# Patient Record
Sex: Female | Born: 1965 | Race: White | Hispanic: No | Marital: Married | Smoking: Never smoker
Health system: Southern US, Community
[De-identification: ages and names within clinical notes are randomized; demographics above are authoritative.]

## PROBLEM LIST (undated history)

## (undated) DIAGNOSIS — R112 Nausea with vomiting, unspecified: Secondary | ICD-10-CM

## (undated) DIAGNOSIS — I82409 Acute embolism and thrombosis of unspecified deep veins of unspecified lower extremity: Secondary | ICD-10-CM

## (undated) DIAGNOSIS — R000 Tachycardia, unspecified: Secondary | ICD-10-CM

## (undated) DIAGNOSIS — D682 Hereditary deficiency of other clotting factors: Secondary | ICD-10-CM

## (undated) DIAGNOSIS — I10 Essential (primary) hypertension: Secondary | ICD-10-CM

## (undated) DIAGNOSIS — E785 Hyperlipidemia, unspecified: Secondary | ICD-10-CM

## (undated) DIAGNOSIS — Z9889 Other specified postprocedural states: Secondary | ICD-10-CM

## (undated) DIAGNOSIS — I739 Peripheral vascular disease, unspecified: Secondary | ICD-10-CM

## (undated) DIAGNOSIS — R51 Headache: Secondary | ICD-10-CM

## (undated) HISTORY — PX: CHOLECYSTECTOMY: SHX55

## (undated) HISTORY — PX: TONSILLECTOMY: SUR1361

## (undated) HISTORY — PX: TUBAL LIGATION: SHX77

## (undated) HISTORY — PX: APPENDECTOMY: SHX54

## (undated) HISTORY — PX: RENAL CYST EXCISION: SUR506

## (undated) HISTORY — PX: OTHER SURGICAL HISTORY: SHX169

## (undated) HISTORY — PX: COLON SURGERY: SHX602

## (undated) HISTORY — DX: Hyperlipidemia, unspecified: E78.5

---

## 1996-07-26 HISTORY — PX: TUBAL LIGATION: SHX77

## 1997-07-26 HISTORY — PX: CHOLECYSTECTOMY: SHX55

## 1998-07-26 HISTORY — PX: BREAST REDUCTION SURGERY: SHX8

## 1998-09-11 ENCOUNTER — Other Ambulatory Visit: Admission: RE | Admit: 1998-09-11 | Discharge: 1998-09-11 | Payer: Self-pay | Admitting: Plastic Surgery

## 1999-08-14 ENCOUNTER — Other Ambulatory Visit: Admission: RE | Admit: 1999-08-14 | Discharge: 1999-08-14 | Payer: Self-pay | Admitting: *Deleted

## 2000-08-22 ENCOUNTER — Other Ambulatory Visit: Admission: RE | Admit: 2000-08-22 | Discharge: 2000-08-22 | Payer: Self-pay | Admitting: Obstetrics and Gynecology

## 2003-06-20 ENCOUNTER — Emergency Department (HOSPITAL_COMMUNITY): Admission: EM | Admit: 2003-06-20 | Discharge: 2003-06-20 | Payer: Self-pay | Admitting: Emergency Medicine

## 2003-09-09 ENCOUNTER — Other Ambulatory Visit: Admission: RE | Admit: 2003-09-09 | Discharge: 2003-09-09 | Payer: Self-pay | Admitting: Obstetrics and Gynecology

## 2005-01-28 ENCOUNTER — Emergency Department (HOSPITAL_COMMUNITY): Admission: EM | Admit: 2005-01-28 | Discharge: 2005-01-28 | Payer: Self-pay | Admitting: Family Medicine

## 2005-12-16 ENCOUNTER — Other Ambulatory Visit: Admission: RE | Admit: 2005-12-16 | Discharge: 2005-12-16 | Payer: Self-pay | Admitting: Obstetrics and Gynecology

## 2006-07-26 DIAGNOSIS — R000 Tachycardia, unspecified: Secondary | ICD-10-CM

## 2006-07-26 DIAGNOSIS — I739 Peripheral vascular disease, unspecified: Secondary | ICD-10-CM

## 2006-07-26 DIAGNOSIS — E785 Hyperlipidemia, unspecified: Secondary | ICD-10-CM

## 2006-07-26 HISTORY — DX: Peripheral vascular disease, unspecified: I73.9

## 2006-07-26 HISTORY — DX: Tachycardia, unspecified: R00.0

## 2006-07-26 HISTORY — DX: Hyperlipidemia, unspecified: E78.5

## 2006-12-08 ENCOUNTER — Encounter: Admission: RE | Admit: 2006-12-08 | Discharge: 2006-12-08 | Payer: Self-pay

## 2007-01-17 ENCOUNTER — Ambulatory Visit (HOSPITAL_COMMUNITY): Admission: RE | Admit: 2007-01-17 | Discharge: 2007-01-17 | Payer: Self-pay | Admitting: Obstetrics and Gynecology

## 2007-01-26 ENCOUNTER — Encounter (INDEPENDENT_AMBULATORY_CARE_PROVIDER_SITE_OTHER): Payer: Self-pay | Admitting: Obstetrics and Gynecology

## 2007-01-26 ENCOUNTER — Ambulatory Visit (HOSPITAL_COMMUNITY): Admission: RE | Admit: 2007-01-26 | Discharge: 2007-01-26 | Payer: Self-pay | Admitting: Obstetrics and Gynecology

## 2007-11-16 ENCOUNTER — Ambulatory Visit (HOSPITAL_COMMUNITY): Admission: RE | Admit: 2007-11-16 | Discharge: 2007-11-17 | Payer: Self-pay | Admitting: Surgery

## 2007-11-16 ENCOUNTER — Encounter (INDEPENDENT_AMBULATORY_CARE_PROVIDER_SITE_OTHER): Payer: Self-pay | Admitting: Surgery

## 2007-11-16 ENCOUNTER — Encounter: Payer: Self-pay | Admitting: Obstetrics and Gynecology

## 2008-03-06 ENCOUNTER — Ambulatory Visit (HOSPITAL_COMMUNITY): Admission: RE | Admit: 2008-03-06 | Discharge: 2008-03-06 | Payer: Self-pay | Admitting: *Deleted

## 2009-03-06 ENCOUNTER — Ambulatory Visit (HOSPITAL_COMMUNITY): Admission: RE | Admit: 2009-03-06 | Discharge: 2009-03-06 | Payer: Self-pay | Admitting: Internal Medicine

## 2009-04-05 ENCOUNTER — Emergency Department (HOSPITAL_COMMUNITY): Admission: EM | Admit: 2009-04-05 | Discharge: 2009-04-05 | Payer: Self-pay | Admitting: Family Medicine

## 2009-04-05 ENCOUNTER — Emergency Department (HOSPITAL_COMMUNITY): Admission: EM | Admit: 2009-04-05 | Discharge: 2009-04-05 | Payer: Self-pay | Admitting: Emergency Medicine

## 2010-05-15 ENCOUNTER — Encounter: Admission: RE | Admit: 2010-05-15 | Discharge: 2010-05-15 | Payer: Self-pay | Admitting: Sports Medicine

## 2010-08-15 ENCOUNTER — Encounter: Payer: Self-pay | Admitting: Sports Medicine

## 2010-08-17 ENCOUNTER — Encounter: Payer: Self-pay | Admitting: Internal Medicine

## 2010-12-08 NOTE — Op Note (Signed)
Angela Washington, Angela Washington NO.:  [id number]   MEDICAL RECORD NO.:  [id number]          PATIENT TYPE:  INP   LOCATION:  1506                         FACILITY:  [facility]   PHYSICIAN:  Wilmon Arms. Corliss Skains, M.D. DATE OF BIRTH:  04-Jul-1966   DATE OF PROCEDURE:  11/16/2007  DATE OF DISCHARGE:                               OPERATIVE REPORT   PREOPERATIVE DIAGNOSES:  Acute appendicitis.   POSTOPERATIVE DIAGNOSES:  1. Mild appendicitis.  2. Ruptured hemorrhagic left ovarian cyst.   PROCEDURE PERFORMED:  1. Laparoscopic appendectomy.  2. Laparoscopic left ovarian cystectomy.   SURGEON:  Wilmon Arms. Corliss Skains, M.D., FACS   ANESTHESIA:  General endotracheal.   INDICATIONS:  The patient is a 45 year old female who presents with 1  day of sudden onset of right lower quadrant pain.  She was initially  evaluated at [facility] and her white count was mildly elevated at  10.8.  A CT scan showed findings consistent with a possible early  appendicitis.  Her appendiceal wall was mildly thickened at its base.  She also was noted to have a large left ovarian cysts.  This was  confirmed with pelvic ultrasound.  It is felt that the cyst was a simple  cyst and was otherwise unremarkable.  She was then referred for  consultation.  We decided to move her over to [facility] to perform a  laparoscopic appendectomy.  Her abdominal examination was consistent  with this diagnosis.   DESCRIPTION OF PROCEDURE:  The patient was brought to the operating room  and placed in supine position on the operating table.  After an adequate  level of general anesthesia was obtained a Foley catheter was placed  under sterile technique.  The patient's abdomen was prepped with  Betadine and draped in sterile fashion.  Time-out was taken to assure  proper patient and proper procedure.  Her previous laparoscopic incision  below her umbilicus was infiltrated with 0.25% Marcaine with  epinephrine.  Dissection was  carried down to the fascia.  The fascia was  opened vertically.  We entered the peritoneal cavity bluntly.  A stay  suture of 0 Vicryl was placed around the fascial opening.  The Hasson  cannula was inserted, secured to the stay suture.  Pneumoperitoneum was  obtained by insufflating CO2, maintaining maximal pressure of 15 mmHg.  The laparoscope was inserted.  Immediately I noticed a lot of blood  staining over the omentum with some pooling of blood in the pelvis.  The  peritoneum appeared generally irritated.  We placed a 5 mm port in the  right upper quadrant and another 5 mm port in the left lower quadrant.  The patient was positioned in Trendelenburg and tilted to her left.  We  mobilized the cecum medially.  A long narrow appendix was identified and  was grasped with Glassman clamp.  There was minimal inflammation but the  appendix did feel mildly thickened near its base.  The mesoappendix was  divided with the harmonic scalpel.  The base of the appendix was then  amputated with a Endo-GIA stapler.  One edge  of the staple line was  oozing.  This was controlled with the harmonic scalpel.  Hemostasis was  good at the staple line.  This area was thoroughly irrigated.  We then  began exploring her pelvis for other signs of the bloody appearing  fluid.  Her right ovary was elevated and was grossly normal.  We then  elevated the left ovary.  The large cyst that had previously been  identified was easily seen.  There were two openings in the cyst and  blood was actively oozing from this area.  It appeared that this was the  source of her pelvic irritation.  The cyst was bluntly opened wider with  the suction tip.  We evacuated the cyst contents.  Some of these  contents were removed and sent for pathologic examination.  I then  thoroughly irrigated the cyst wall.  There were several bleeding areas  which were thoroughly cauterized.  Once hemostasis was adequate we  thoroughly irrigated the  anterior abdominal wall in the pelvis.  We  suctioned out as much irrigation as possible.  We once again inspected  our staple line in the right lower quadrant and there was no sign of  bleeding or leak.  Pneumoperitoneum was then released as we removed the  trocars.  The stay sutures used to close umbilical fascia.  Deep sutures  of 4-0 Monocryl were placed.  Dermabond was used to seal the skin.  The  patient was then extubated and brought to recovery room in stable  condition.  The Foley catheter was removed.      Wilmon Arms. Tsuei, M.D.  Electronically Signed     MKT/MEDQ  D:  11/16/2007  T:  11/17/2007  Job:  [id number]   cc:   Hal Morales, M.D.  Fax: [phone]

## 2010-12-08 NOTE — Op Note (Signed)
Angela Washington, Angela Washington                ACCOUNT NO.:  [id number]   MEDICAL RECORD NO.:  [id number]          PATIENT TYPE:  AMB   LOCATION:  SDC                           FACILITY:  [facility]   PHYSICIAN:  Angela Washington, M.D. DATE OF BIRTH:  [birth year]   DATE OF PROCEDURE:  01/26/2007  DATE OF DISCHARGE:                               OPERATIVE REPORT   PREOPERATIVE DIAGNOSIS:  Menorrhagia and uterine fibroids.   POSTOPERATIVE DIAGNOSIS:  Menorrhagia and uterine fibroids.   PROCEDURE:  D&C hysteroscopy, endometrial ablation with ThermaChoice.   SURGEON:  Dr. Normand Washington and no assistants.   ANESTHESIA:  Laryngeal mask airway and local.   SPECIMENS:  Endometrial curettings.   BLOOD LOSS:  Was minimal.   COMPLICATIONS:  None.  The patient went to PACU in stable condition.   DESCRIPTION OF PROCEDURE:  The patient taken to the operating room where  she was given general anesthesia, placed in dorsal lithotomy position  and prepped and draped in a normal sterile fashion.  Her bladder was  drained with a catheter.  Uterus was found to be 9-10 weeks size,  anteverted with no adnexal masses.  A bivalve speculum placed into the  vagina.  The anterior lip of the cervix was grasped single-tooth  tenaculum.  The uterus did sound to 9 cm. The cervix was further dilated  Pratt dilators up to 21. The hysteroscope was placed into the uterine  cavity.  The patient had scant endometrium.  Both ostia were visualized  and there were no polyps or submucosal fibroids seen.  The hysteroscope  was then removed.  Sharp endometrial curettings were obtained with the  uterine curette and sent to pathology.  The ThermaChoice was done per  protocol.  The probe was placed into the uterus and sounded to 9 cm and  had a warm-up period of 1 minute and 15 seconds and ablated the uterus  for 8 minutes and had a cool down period of about 2 minutes. The  ThermaChoice was removed.  The second look was done with the  hysteroscope. Both ostia were visualized which [place] pink.  Most of the  endometrium had been ablated.  There was just moved was slight pink area  near both ostia but I would say 95% of the uterus was ablated.  Sponge,  lap and needle counts were correct.  The tenaculum site was hemostatic.  The patient went to recovery room in stable condition.      Angela A. Angela Washington, M.D.  Electronically Signed     NAD/MEDQ  D:  01/26/2007  T:  01/26/2007  Job:  [id number]

## 2010-12-08 NOTE — H&P (Signed)
Angela Washington, Washington                ACCOUNT NO.:  [id number]   MEDICAL RECORD NO.:  [id number]          PATIENT TYPE:  AMB   LOCATION:  SDC                           FACILITY:  [facility]   PHYSICIAN:  Angela Washington, M.D. DATE OF BIRTH:  16-Oct-1965   DATE OF ADMISSION:  01/17/2007  DATE OF DISCHARGE:  01/17/2007                              HISTORY & PHYSICAL   CONFIDENTIAL.   The patient is a 45 year old female gravida 3, para 2, who presented to  me in June of 2008, complaining of right-sided pain and heavy periods.  She occasionally takes Motrin for the right-sided pain.  Also has a  history of a right ovarian cyst.  Her menses occur every month but soak  through her clothes for the first 2 days of her menstrual cycle.  Her  menstrual cycle lasts about 5 days.  She denies any shortness of breath  or chest pain.  She does have a small 1.3 cm fibroid on ultrasound.  She  is currently on no contraception, not on hormone therapy.  She has no  new medications.  She has no menopausal symptoms or vaginal discharge.  She does have cramping with her menses which is usually relieved with  Motrin and no increased stress.  Patient denies any vaginal discharge or  odor, dysuria, urgency, frequency or hematuria, history of kidney  stones, constipation, diarrhea, nausea, vomiting or endometriosis.  She  has a history of an ovarian cyst.  She denies any bleeding disorders or  thyroid disorders.  Patient says that the bleeding does interrupt her to  be able to do her normal daily activities every month.   PAST MEDICAL HISTORY:  Significant for pancreatitis.   PAST SURGICAL HISTORY:  Significant for:  1. Cesarean section x2.  2. Cholecystectomy.  3. Bilateral tubal ligation.   MEDICATIONS:  Calcium and folic acid.   ALLERGIES:  1. MORPHINE.  2. UNASYN.   SOCIAL HISTORY:  Negative for tobacco, alcohol and illicit drug use.   PHYSICAL EXAM:  Patient weighs 168 pounds, blood pressure is  120/80.  Pupils are equal, hearing is normal, throat is clear, thyroid is not  enlarged.  HEART:  Regular rate and rhythm.  LUNGS:  Clear to auscultation bilaterally.  BREASTS:  No masses, discharge, skin changes or nipple retraction.  BACK:  No CVA tenderness bilaterally.  ABDOMEN:  Nontender without any masses or organomegaly.  EXTREMITIES:  No cyanosis, clubbing or edema.  NEURO EXAM:  Within normal limits.  VULVOVAGINAL EXAM:  Within normal limits.  CERVIX:  Nontender without any lesions.  UTERUS:  Normal shape, size and consistency and nontender.  ADNEXA:  No masses.   On ultrasound, her uterus is normal size with a 1.3 cm fibroid, normal  ovaries bilaterally.   ASSESSMENT:  Menorrhagia.  All different treatments were reviewed with  the patient from observation to birth control pills, ablation,  hysterectomy and also treatment for the fibroid.  Patient has decided to  go with endometrial ablation.  She understands the risks are, but not  limited to, bleeding, infection, perforation of the uterus  and damage to  internal organs like bowel and bladder, major blood vessels.  The  patient understands that there is a small failure rate of about 10-15%  and she still wants to proceed.  The patient also had a Pap smear which  showed azygos was negative, high risk HPV.  She desires to repeat the  Pap smear in a year.      Angela Washington, M.D.  Electronically Signed     NAD/MEDQ  D:  01/25/2007  T:  01/26/2007  Job:  [id number]

## 2011-04-20 LAB — COMPREHENSIVE METABOLIC PANEL
ALT: 16
AST: 18
Albumin: 3.8
Alkaline Phosphatase: 65
BUN: 11
CO2: 27
Calcium: 9.3
Chloride: 98
Creatinine, Ser: 0.79
GFR calc Af Amer: >60
GFR calc non Af Amer: >60
Glucose, Bld: 92
Potassium: 4.3
Sodium: 134 — ABNORMAL LOW
Total Bilirubin: 1
Total Protein: 7.2

## 2011-04-20 LAB — CBC
HCT: 44.7
Hemoglobin: 15.7 — ABNORMAL HIGH
MCHC: 35.1
MCV: 88.8
Platelets: 253
RBC: 5.03
RDW: 12.4
WBC: 10.8 — ABNORMAL HIGH

## 2011-04-20 LAB — DIFFERENTIAL
Basophils Absolute: 0
Basophils Relative: 0
Eosinophils Absolute: 0.1
Eosinophils Relative: 1
Lymphocytes Relative: 25
Lymphs Abs: 2.7
Monocytes Absolute: 0.9
Monocytes Relative: 9
Neutro Abs: 7
Neutrophils Relative %: 65

## 2011-04-20 LAB — PREGNANCY, URINE: Preg Test, Ur: NEGATIVE

## 2011-04-20 LAB — AMYLASE: Amylase: 103

## 2011-04-20 LAB — LIPASE, BLOOD: Lipase: 31

## 2011-05-11 LAB — CBC
HCT: 44.4
Hemoglobin: 15.4 — ABNORMAL HIGH
MCHC: 34.6
MCV: 86.7
Platelets: 277
RBC: 5.13 — ABNORMAL HIGH
RDW: 12.9
WBC: 7.4

## 2011-05-11 LAB — PREGNANCY, URINE: Preg Test, Ur: NEGATIVE

## 2011-06-23 ENCOUNTER — Ambulatory Visit (HOSPITAL_COMMUNITY)
Admission: RE | Admit: 2011-06-23 | Discharge: 2011-06-23 | Disposition: A | Discharge status: Home / Self Care | Payer: BC Managed Care – PPO | Source: Ambulatory Visit | Attending: Internal Medicine | Admitting: Internal Medicine

## 2011-06-23 ENCOUNTER — Other Ambulatory Visit (HOSPITAL_COMMUNITY): Payer: Self-pay | Admitting: Internal Medicine

## 2011-06-23 DIAGNOSIS — R609 Edema, unspecified: Secondary | ICD-10-CM

## 2011-06-23 DIAGNOSIS — I8000 Phlebitis and thrombophlebitis of superficial vessels of unspecified lower extremity: Secondary | ICD-10-CM | POA: Insufficient documentation

## 2011-06-23 DIAGNOSIS — M79609 Pain in unspecified limb: Secondary | ICD-10-CM | POA: Insufficient documentation

## 2011-06-30 ENCOUNTER — Ambulatory Visit: Payer: BC Managed Care – PPO | Attending: Gynecologic Oncology | Admitting: Gynecologic Oncology

## 2011-06-30 ENCOUNTER — Encounter: Payer: Self-pay | Admitting: Gynecologic Oncology

## 2011-06-30 VITALS — BP 110/76 | HR 60 | Temp 97.7°F | Resp 14 | Ht 66.42 in | Wt 178.4 lb

## 2011-06-30 DIAGNOSIS — N72 Inflammatory disease of cervix uteri: Secondary | ICD-10-CM | POA: Insufficient documentation

## 2011-06-30 DIAGNOSIS — E785 Hyperlipidemia, unspecified: Secondary | ICD-10-CM | POA: Insufficient documentation

## 2011-06-30 DIAGNOSIS — N839 Noninflammatory disorder of ovary, fallopian tube and broad ligament, unspecified: Secondary | ICD-10-CM | POA: Insufficient documentation

## 2011-06-30 DIAGNOSIS — Z79899 Other long term (current) drug therapy: Secondary | ICD-10-CM | POA: Insufficient documentation

## 2011-06-30 DIAGNOSIS — R1900 Intra-abdominal and pelvic swelling, mass and lump, unspecified site: Secondary | ICD-10-CM

## 2011-06-30 DIAGNOSIS — IMO0002 Reserved for concepts with insufficient information to code with codable children: Secondary | ICD-10-CM | POA: Insufficient documentation

## 2011-06-30 DIAGNOSIS — D6859 Other primary thrombophilia: Secondary | ICD-10-CM | POA: Insufficient documentation

## 2011-06-30 DIAGNOSIS — R109 Unspecified abdominal pain: Secondary | ICD-10-CM | POA: Insufficient documentation

## 2011-06-30 DIAGNOSIS — Z7901 Long term (current) use of anticoagulants: Secondary | ICD-10-CM | POA: Insufficient documentation

## 2011-06-30 DIAGNOSIS — R232 Flushing: Secondary | ICD-10-CM | POA: Insufficient documentation

## 2011-06-30 DIAGNOSIS — R52 Pain, unspecified: Secondary | ICD-10-CM

## 2011-06-30 NOTE — Patient Instructions (Signed)
To schedule your MRI and I will call you with the results.

## 2011-06-30 NOTE — Progress Notes (Signed)
Consult Note: Gyn-Onc  Elijah Birk 45 y.o. female  CC:  Chief Complaint  Patient presents with  . Abdominal Pain    HPI: Patient contain consultation request of Dr. Normand Sloop. Ms. Neiss is a 45 year old gravida 3 para 2 aborta 1. She's not having any menstrual cycles as she underwent endometrial ablation in 2008. She was seen by Dr. Normand Sloop has abdominal pain and pelvic mass is appreciated on ultrasound. This then prompted an ultrasound in August of 2008 this showed a simple-appearing 3 cm right ovarian mass. I do not have records from that visit. Followup ultrasound was then performed on November 28 revealed a right ovarian septated cyst measuring 3.9 x 1.7 x 1.8 cm with no increased flow. The cervix had multiple cysts ranging from 1.2.5 centimeters. It is in the body fibroid measuring 1.9 x 2.1 x 1.4 cm. Her CA 125 is normal. The ultrasound findings prompted a consultation today. She is doing quite well. 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 She does have hot flashes though not regular. She does have occasional dyspareunia and has been going on for a few months. She denies any early satiety she does have some achiness her abdomen. She has a change about bladder habits an intentional weight loss weight gain nausea or vomiting. She had a right groin phlebitis but has resolved on its own.  Interval History: As above  Review of Systems: As above  Current Meds:  Outpatient Encounter Prescriptions as of 06/30/2011  Medication Sig Dispense Refill  . folic acid (FOLVITE) 1 MG tablet Take 1 mg by mouth daily.        . simvastatin (ZOCOR) 40 MG tablet Take 40 mg by mouth 1 day or 1 dose.        . spironolactone (ALDACTONE) 25 MG tablet Take 25 mg by mouth daily.        Marland Kitchen warfarin (COUMADIN) 2.5 MG tablet Take 2.5 mg by mouth daily. Alternate between 2.5 and 5 mg         Allergy:  Allergies  Allergen Reactions  . Morphine And Related Hives    Social Hx:   History     Social History  . Marital Status: Married    Spouse Name: N/A    Number of Children: N/A  . Years of Education: N/A   Occupational History  . Not on file.   Social History Main Topics  . Smoking status: Never Smoker   . Smokeless tobacco: Not on file  . Alcohol Use: No  . Drug Use: No  . Sexually Active: Yes   Other Topics Concern  . Not on file   Social History Narrative  . No narrative on file    Past Surgical Hx:  Past Surgical History  Procedure Date  . Tonsillectomy   . Cesarean section 1994, 1998    2  . Lapor     Ovarian Cyst and appedectomy  . Appendectomy   . Cholecystectomy   . Tubal ligation   . 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 Past Medical History  Diagnosis Date  . Hyperlipidemia 2008    Family Hx: Her father died at the age of 51 seconds death. Paternal grandfather and 2 paternal uncles died is intact and the age of 49. A paternal aunt died suddenly at the age of 77. They're most likely related to a prothrombin deficiency  but was not diagnosed at the time.  Family History  Problem Relation Age of Onset  . Parkinsonism Mother   . Heart attack Mother   . Heart attack Father   . Thyroid cancer Sister     Vitals:  Blood pressure 110/76, pulse 60, temperature 97.7 F (36.5 C), temperature source Oral, resp. rate 14, height 5' 6.42" (1.687 m), weight 178 lb 6.4 oz (80.922 kg).  Physical Exam: Well-developed female in no acute distress.  Neck: Supple no lymphadenopathy no thyromegaly.  Lungs: Clear to auscultation bilaterally.  Cardiovascular exam: Regular rate and rhythm.  Abdomen: Surgical incisions. No evidence of an incisional hernia. Abdomen is soft tender to deep palpation in the upper quadrant. No distinct masses.   Groin: He.  Extremities: No edema.  Pelvic external genitalia within normal limits. The vagina is well epithelialized. The other  process. O2 nabothian cysts seen at 9:00 and nabothian cysts seen at approximately 3:00. You should measure approximately 4-5 mm. Bimanual examination the cervix is palpable normal the purpose of normal size shape and consistency. In the right adnexa there is a 4 cm mass. There is no nodularity rectal confirms.   Assessment/Plan:45 year old with history of endometrial ablation as well as right ovarian hemorrhagic cyst. 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 The mass is palpable this could very well be related to another hemorrhagic cyst. In addition she has several cysts noted in the uterus and cervix. I have had several patients with these and all of them have have been post ablation. We discussed the pros and cons of additional imaging regarding ultrasound and potentially MRI. At this point she wishes to proceed with an MRI to better evaluate the endomyometrium as well as take a look at the ovarian mass. We will put the order in and then she'll schedule her convenience. She does not recall her of the results of the MRI and we'll determine her disposition pending these results. Her questions were elicited in answer to her satisfaction. She was pleased with her consultation.   Witney Huie A., MD 06/30/2011, 2:04 PM

## 2011-07-02 ENCOUNTER — Ambulatory Visit (HOSPITAL_COMMUNITY)
Admission: RE | Admit: 2011-07-02 | Discharge: 2011-07-02 | Disposition: A | Discharge status: Home / Self Care | Payer: BC Managed Care – PPO | Source: Ambulatory Visit | Attending: Gynecologic Oncology | Admitting: Gynecologic Oncology

## 2011-07-02 DIAGNOSIS — N949 Unspecified condition associated with female genital organs and menstrual cycle: Secondary | ICD-10-CM | POA: Insufficient documentation

## 2011-07-02 DIAGNOSIS — N7013 Chronic salpingitis and oophoritis: Secondary | ICD-10-CM | POA: Insufficient documentation

## 2011-07-02 DIAGNOSIS — N83209 Unspecified ovarian cyst, unspecified side: Secondary | ICD-10-CM | POA: Insufficient documentation

## 2011-07-02 DIAGNOSIS — R1900 Intra-abdominal and pelvic swelling, mass and lump, unspecified site: Secondary | ICD-10-CM

## 2011-07-02 MED ORDER — GADOBENATE DIMEGLUMINE 529 MG/ML IV SOLN
15.0000 mL | Freq: Once | INTRAVENOUS | Status: AC | PRN
  Administered 2011-07-02: 15 mL via INTRAVENOUS

## 2011-07-06 ENCOUNTER — Ambulatory Visit: Payer: BC Managed Care – PPO | Admitting: Gynecologic Oncology

## 2011-07-27 HISTORY — PX: APPENDECTOMY: SHX54

## 2012-02-24 ENCOUNTER — Other Ambulatory Visit: Payer: Self-pay | Admitting: Obstetrics and Gynecology

## 2012-02-24 ENCOUNTER — Encounter (HOSPITAL_COMMUNITY): Payer: Self-pay

## 2012-02-24 ENCOUNTER — Encounter (HOSPITAL_COMMUNITY): Payer: Self-pay | Admitting: Anesthesiology

## 2012-02-24 ENCOUNTER — Encounter (HOSPITAL_COMMUNITY)
Admission: AD | Disposition: A | Discharge status: Home / Self Care | Payer: Self-pay | Source: Ambulatory Visit | Attending: Obstetrics and Gynecology

## 2012-02-24 ENCOUNTER — Inpatient Hospital Stay (HOSPITAL_COMMUNITY): Payer: BC Managed Care – PPO | Admitting: Anesthesiology

## 2012-02-24 ENCOUNTER — Inpatient Hospital Stay (HOSPITAL_COMMUNITY): Admission: AD | Admit: 2012-02-24 | Payer: BC Managed Care – PPO

## 2012-02-24 ENCOUNTER — Ambulatory Visit (HOSPITAL_COMMUNITY)
Admission: AD | Admit: 2012-02-24 | Discharge: 2012-02-25 | Disposition: A | Discharge status: Home / Self Care | Payer: BC Managed Care – PPO | Source: Ambulatory Visit | Attending: Obstetrics and Gynecology | Admitting: Obstetrics and Gynecology

## 2012-02-24 DIAGNOSIS — N83519 Torsion of ovary and ovarian pedicle, unspecified side: Secondary | ICD-10-CM

## 2012-02-24 DIAGNOSIS — R1031 Right lower quadrant pain: Secondary | ICD-10-CM | POA: Insufficient documentation

## 2012-02-24 DIAGNOSIS — N8353 Torsion of ovary, ovarian pedicle and fallopian tube: Secondary | ICD-10-CM | POA: Insufficient documentation

## 2012-02-24 DIAGNOSIS — N83209 Unspecified ovarian cyst, unspecified side: Secondary | ICD-10-CM | POA: Insufficient documentation

## 2012-02-24 HISTORY — PX: SALPINGOOPHORECTOMY: SHX82

## 2012-02-24 HISTORY — PX: LAPAROSCOPY: SHX197

## 2012-02-24 LAB — ABO/RH: ABO/RH(D): A POS

## 2012-02-24 LAB — APTT: aPTT: 50 seconds — ABNORMAL HIGH (ref 24–37)

## 2012-02-24 LAB — PROTIME-INR
INR: 3 — ABNORMAL HIGH (ref 0.00–1.49)
Prothrombin Time: 31.6 seconds — ABNORMAL HIGH (ref 11.6–15.2)

## 2012-02-24 LAB — CBC
Hemoglobin: 15.9 g/dL — ABNORMAL HIGH (ref 12.0–15.0)
MCH: 30 pg (ref 26.0–34.0)
MCHC: 34.9 g/dL (ref 30.0–36.0)
MCV: 86 fL (ref 78.0–100.0)
RBC: 5.3 MIL/uL — ABNORMAL HIGH (ref 3.87–5.11)

## 2012-02-24 SURGERY — LAPAROSCOPY OPERATIVE
Anesthesia: General | Laterality: Right | Wound class: Clean Contaminated

## 2012-02-24 MED ORDER — FENTANYL CITRATE 0.05 MG/ML IJ SOLN
INTRAMUSCULAR | Status: DC | PRN
  Administered 2012-02-24: 100 ug via INTRAVENOUS
  Administered 2012-02-24: 50 ug via INTRAVENOUS
  Administered 2012-02-24: 100 ug via INTRAVENOUS

## 2012-02-24 MED ORDER — IBUPROFEN 800 MG PO TABS: 800.0000 mg | ORAL_TABLET | Freq: Three times a day (TID) | ORAL | Status: DC | PRN

## 2012-02-24 MED ORDER — DIPHENHYDRAMINE HCL 50 MG/ML IJ SOLN: 12.5000 mg | Freq: Four times a day (QID) | INTRAMUSCULAR | Status: DC | PRN

## 2012-02-24 MED ORDER — FENTANYL CITRATE 0.05 MG/ML IJ SOLN: 25.0000 ug | INTRAMUSCULAR | Status: DC | PRN

## 2012-02-24 MED ORDER — HYDROCODONE-ACETAMINOPHEN 5-325 MG PO TABS
2.0000 | ORAL_TABLET | ORAL | Status: DC | PRN
  Administered 2012-02-24: 1 via ORAL
  Administered 2012-02-25: 2 via ORAL
  Administered 2012-02-25: 1 via ORAL
  Filled 2012-02-24: qty 1
  Filled 2012-02-24: qty 2
  Filled 2012-02-24: qty 1

## 2012-02-24 MED ORDER — MIDAZOLAM HCL 5 MG/5ML IJ SOLN
INTRAMUSCULAR | Status: DC | PRN
  Administered 2012-02-24: 2 mg via INTRAVENOUS

## 2012-02-24 MED ORDER — ONDANSETRON HCL 4 MG/2ML IJ SOLN
INTRAMUSCULAR | Status: DC | PRN
  Administered 2012-02-24: 8 mg via INTRAVENOUS

## 2012-02-24 MED ORDER — FAMOTIDINE IN NACL 20-0.9 MG/50ML-% IV SOLN
20.0000 mg | Freq: Once | INTRAVENOUS | Status: AC
  Administered 2012-02-24: 20 mg via INTRAVENOUS
  Filled 2012-02-24: qty 50

## 2012-02-24 MED ORDER — VITAMIN K1 10 MG/ML IJ SOLN
10.0000 mg | Freq: Once | INTRAVENOUS | Status: AC
  Administered 2012-02-24: 10 mg via INTRAVENOUS
  Filled 2012-02-24: qty 1

## 2012-02-24 MED ORDER — HYDROMORPHONE 0.3 MG/ML IV SOLN: INTRAVENOUS | Status: DC

## 2012-02-24 MED ORDER — ONDANSETRON HCL 4 MG/2ML IJ SOLN: 4.0000 mg | Freq: Four times a day (QID) | INTRAMUSCULAR | Status: DC | PRN

## 2012-02-24 MED ORDER — CEFAZOLIN SODIUM-DEXTROSE 2-3 GM-% IV SOLR
2.0000 g | INTRAVENOUS | Status: AC
  Administered 2012-02-24: 2 g via INTRAVENOUS
  Filled 2012-02-24: qty 50

## 2012-02-24 MED ORDER — SPIRONOLACTONE 25 MG PO TABS
25.0000 mg | ORAL_TABLET | Freq: Every day | ORAL | Status: DC
  Administered 2012-02-25: 25 mg via ORAL
  Filled 2012-02-24 (×2): qty 1

## 2012-02-24 MED ORDER — MIDAZOLAM HCL 2 MG/2ML IJ SOLN: 0.5000 mg | Freq: Once | INTRAMUSCULAR | Status: DC | PRN

## 2012-02-24 MED ORDER — FOLIC ACID 1 MG PO TABS
1.0000 mg | ORAL_TABLET | Freq: Every day | ORAL | Status: DC
  Administered 2012-02-25: 1 mg via ORAL
  Filled 2012-02-24 (×2): qty 1

## 2012-02-24 MED ORDER — GLYCOPYRROLATE 0.2 MG/ML IJ SOLN
INTRAMUSCULAR | Status: DC | PRN
  Administered 2012-02-24: 0.6 mg via INTRAVENOUS

## 2012-02-24 MED ORDER — DIPHENHYDRAMINE HCL 12.5 MG/5ML PO ELIX: 12.5000 mg | ORAL_SOLUTION | Freq: Four times a day (QID) | ORAL | Status: DC | PRN

## 2012-02-24 MED ORDER — MEPERIDINE HCL 25 MG/ML IJ SOLN: 6.2500 mg | INTRAMUSCULAR | Status: DC | PRN

## 2012-02-24 MED ORDER — WARFARIN SODIUM 5 MG PO TABS
5.0000 mg | ORAL_TABLET | Freq: Every day | ORAL | Status: DC
  Filled 2012-02-24: qty 1

## 2012-02-24 MED ORDER — DEXTROSE IN LACTATED RINGERS 5 % IV SOLN
INTRAVENOUS | Status: DC
  Administered 2012-02-24: 1000 mL via INTRAVENOUS

## 2012-02-24 MED ORDER — PROMETHAZINE HCL 25 MG/ML IJ SOLN
6.2500 mg | INTRAMUSCULAR | Status: AC | PRN
  Administered 2012-02-24 (×2): 6.25 mg via INTRAVENOUS

## 2012-02-24 MED ORDER — SCOPOLAMINE 1 MG/3DAYS TD PT72
MEDICATED_PATCH | TRANSDERMAL | Status: AC
  Filled 2012-02-24: qty 1

## 2012-02-24 MED ORDER — HYDROMORPHONE HCL PF 1 MG/ML IJ SOLN
2.0000 mg | Freq: Once | INTRAMUSCULAR | Status: AC | PRN
  Administered 2012-02-24: 2 mg via INTRAVENOUS
  Filled 2012-02-24 (×2): qty 2

## 2012-02-24 MED ORDER — MENTHOL 3 MG MT LOZG: 1.0000 | LOZENGE | OROMUCOSAL | Status: DC | PRN

## 2012-02-24 MED ORDER — KETOROLAC TROMETHAMINE 30 MG/ML IJ SOLN: 15.0000 mg | Freq: Once | INTRAMUSCULAR | Status: DC | PRN

## 2012-02-24 MED ORDER — ZOLPIDEM TARTRATE 5 MG PO TABS: 5.0000 mg | ORAL_TABLET | Freq: Every evening | ORAL | Status: DC | PRN

## 2012-02-24 MED ORDER — NEOSTIGMINE METHYLSULFATE 1 MG/ML IJ SOLN
INTRAMUSCULAR | Status: DC | PRN
  Administered 2012-02-24: 4 mg via INTRAVENOUS

## 2012-02-24 MED ORDER — ENOXAPARIN SODIUM 80 MG/0.8ML ~~LOC~~ SOLN
1.0000 mg/kg | Freq: Two times a day (BID) | SUBCUTANEOUS | Status: DC
Start: 2012-02-25 — End: 2012-02-25
  Administered 2012-02-25: 80 mg via SUBCUTANEOUS
  Filled 2012-02-24 (×3): qty 0.8

## 2012-02-24 MED ORDER — PROMETHAZINE HCL 25 MG/ML IJ SOLN
INTRAMUSCULAR | Status: AC
  Filled 2012-02-24: qty 1

## 2012-02-24 MED ORDER — SODIUM CHLORIDE 0.9 % IJ SOLN: 9.0000 mL | INTRAMUSCULAR | Status: DC | PRN

## 2012-02-24 MED ORDER — LACTATED RINGERS IR SOLN
Status: DC | PRN
  Administered 2012-02-24: 3000 mL

## 2012-02-24 MED ORDER — LIDOCAINE HCL (CARDIAC) 20 MG/ML IV SOLN
INTRAVENOUS | Status: DC | PRN
  Administered 2012-02-24: 50 mg via INTRAVENOUS

## 2012-02-24 MED ORDER — METOCLOPRAMIDE HCL 5 MG/ML IJ SOLN
INTRAMUSCULAR | Status: DC | PRN
  Administered 2012-02-24: 10 mg via INTRAVENOUS

## 2012-02-24 MED ORDER — BUPIVACAINE HCL (PF) 0.25 % IJ SOLN
INTRAMUSCULAR | Status: DC | PRN
  Administered 2012-02-24: 30 mL

## 2012-02-24 MED ORDER — ONDANSETRON 8 MG/NS 50 ML IVPB
8.0000 mg | Freq: Three times a day (TID) | INTRAVENOUS | Status: DC
  Administered 2012-02-24 (×2): 8 mg via INTRAVENOUS
  Filled 2012-02-24 (×2): qty 8

## 2012-02-24 MED ORDER — LACTATED RINGERS IV SOLN
INTRAVENOUS | Status: DC | PRN
  Administered 2012-02-24: 16:00:00 via INTRAVENOUS

## 2012-02-24 MED ORDER — ONDANSETRON HCL 4 MG PO TABS: 4.0000 mg | ORAL_TABLET | Freq: Four times a day (QID) | ORAL | Status: DC | PRN

## 2012-02-24 MED ORDER — WARFARIN - PHYSICIAN DOSING INPATIENT: Freq: Every day | Status: DC

## 2012-02-24 MED ORDER — PROPOFOL 10 MG/ML IV EMUL
INTRAVENOUS | Status: DC | PRN
  Administered 2012-02-24: 100 mg via INTRAVENOUS

## 2012-02-24 MED ORDER — CITRIC ACID-SODIUM CITRATE 334-500 MG/5ML PO SOLN
30.0000 mL | Freq: Once | ORAL | Status: AC
  Administered 2012-02-24: 30 mL via ORAL
  Filled 2012-02-24: qty 15

## 2012-02-24 MED ORDER — NALOXONE HCL 0.4 MG/ML IJ SOLN: 0.4000 mg | INTRAMUSCULAR | Status: DC | PRN

## 2012-02-24 MED ORDER — DEXAMETHASONE SODIUM PHOSPHATE 4 MG/ML IJ SOLN
INTRAMUSCULAR | Status: DC | PRN
  Administered 2012-02-24: 10 mg via INTRAVENOUS

## 2012-02-24 MED ORDER — HYDROMORPHONE HCL PF 1 MG/ML IJ SOLN
2.0000 mg | Freq: Once | INTRAMUSCULAR | Status: AC
  Administered 2012-02-24: 2 mg via INTRAVENOUS
  Filled 2012-02-24: qty 2

## 2012-02-24 MED ORDER — HYDROCODONE-ACETAMINOPHEN 10-325 MG PO TABS: 1.0000 | ORAL_TABLET | ORAL | Status: DC | PRN

## 2012-02-24 MED ORDER — PANTOPRAZOLE SODIUM 40 MG PO TBEC
40.0000 mg | DELAYED_RELEASE_TABLET | Freq: Every day | ORAL | Status: DC
  Administered 2012-02-25: 40 mg via ORAL
  Filled 2012-02-24 (×2): qty 1

## 2012-02-24 MED ORDER — DEXTROSE IN LACTATED RINGERS 5 % IV SOLN
INTRAVENOUS | Status: DC
  Administered 2012-02-24 (×2): via INTRAVENOUS

## 2012-02-24 SURGICAL SUPPLY — 34 items
ADH SKN CLS APL DERMABOND .7 (GAUZE/BANDAGES/DRESSINGS) ×1
APPLICATOR COTTON TIP 6IN STRL (MISCELLANEOUS) ×2 IMPLANT
BAG SPEC RTRVL LRG 6X4 10 (ENDOMECHANICALS) ×1
CABLE HIGH FREQUENCY MONO STRZ (ELECTRODE) IMPLANT
CATH ROBINSON RED A/P 16FR (CATHETERS) ×2 IMPLANT
CLOTH BEACON ORANGE TIMEOUT ST (SAFETY) ×2 IMPLANT
DERMABOND ADVANCED (GAUZE/BANDAGES/DRESSINGS) ×1
DERMABOND ADVANCED .7 DNX12 (GAUZE/BANDAGES/DRESSINGS) ×1 IMPLANT
ELECT LIGASURE LONG (ELECTRODE) IMPLANT
EVACUATOR SMOKE 8.L (FILTER) ×2 IMPLANT
FORCEPS CUTTING 33CM 5MM (CUTTING FORCEPS) ×1 IMPLANT
FORCEPS CUTTING 45CM 5MM (CUTTING FORCEPS) IMPLANT
GLOVE BIO SURGEON STRL SZ 6.5 (GLOVE) ×2 IMPLANT
GLOVE BIOGEL PI IND STRL 7.0 (GLOVE) ×2 IMPLANT
GLOVE BIOGEL PI INDICATOR 7.0 (GLOVE) ×2
GOWN PREVENTION PLUS LG XLONG (DISPOSABLE) ×6 IMPLANT
NDL INSUFFLATION 14GA 120MM (NEEDLE) ×1 IMPLANT
NEEDLE INSUFFLATION 14GA 120MM (NEEDLE) IMPLANT
NS IRRIG 1000ML POUR BTL (IV SOLUTION) ×2 IMPLANT
PACK LAPAROSCOPY BASIN (CUSTOM PROCEDURE TRAY) ×2 IMPLANT
PENCIL BUTTON HOLSTER BLD 10FT (ELECTRODE) ×1 IMPLANT
POUCH SPECIMEN RETRIEVAL 10MM (ENDOMECHANICALS) ×1 IMPLANT
PROTECTOR NERVE ULNAR (MISCELLANEOUS) ×2 IMPLANT
SCISSORS LAP 5X35 DISP (ENDOMECHANICALS) IMPLANT
SET IRRIG TUBING LAPAROSCOPIC (IRRIGATION / IRRIGATOR) ×1 IMPLANT
SOLUTION ELECTROLUBE (MISCELLANEOUS) IMPLANT
SUT VICRYL 0 UR6 27IN ABS (SUTURE) ×3 IMPLANT
SUT VICRYL 4-0 PS2 18IN ABS (SUTURE) ×2 IMPLANT
TOWEL OR 17X24 6PK STRL BLUE (TOWEL DISPOSABLE) ×4 IMPLANT
TROCAR BALLN 12MMX100 BLUNT (TROCAR) ×1 IMPLANT
TROCAR Z-THREAD BLADED 11X100M (TROCAR) ×1 IMPLANT
TROCAR Z-THREAD BLADED 5X100MM (TROCAR) ×4 IMPLANT
WARMER LAPAROSCOPE (MISCELLANEOUS) ×2 IMPLANT
WATER STERILE IRR 1000ML POUR (IV SOLUTION) ×2 IMPLANT

## 2012-02-24 NOTE — H&P (Signed)
  H& P on chart  Surgery scheduled at 1PM however delayed due to Pt's INR of 3.0.  Called heme/onc:  To assist with reversal of anticoagulation. Recommend Vit K 10 mg IV and 4 units FFP. Will be able to transfer to OR as 4th unit infusing Anesthesia notified. Will do full dose Dilaudid PCA for pain mgmt. Spoke w/ Dr. Allyson Sabal regarding postop mgmt of coumadin. Pt informed of this.

## 2012-02-24 NOTE — Anesthesia Preprocedure Evaluation (Addendum)
Anesthesia Evaluation  Patient identified by MRN, date of birth, ID band Patient awake    Reviewed: Allergy & Precautions, H&P , Patient's Chart, lab work & pertinent test results, reviewed documented beta blocker date and time   History of Anesthesia Complications Negative for: history of anesthetic complications  Airway Mallampati: III TM Distance: >3 FB and <3 FB Neck ROM: full  Mouth opening: Limited Mouth Opening  Dental No notable dental hx.    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  Reproductive/Obstetrics negative OB ROS                          Anesthesia Physical Anesthesia Plan  ASA: III and Emergent  Anesthesia Plan: General ETT   Post-op Pain Management:    Induction: Rapid sequence, Cricoid pressure planned and Intravenous  Airway Management Planned: Video Laryngoscope Planned  Additional Equipment:   Intra-op Plan:   Post-operative Plan:   Informed Consent: I have reviewed the patients History and Physical, chart, labs and discussed the procedure including the risks, benefits and alternatives for the proposed anesthesia with the patient or authorized representative who has indicated his/her understanding and acceptance.   Dental Advisory Given  Plan Discussed with: CRNA and Surgeon  Anesthesia Plan Comments:        Anesthesia Quick Evaluation

## 2012-02-24 NOTE — MAU Note (Signed)
Pt states r lower abd pain this am starting at 0440, became progressively worse. Per Dr. Cherly Hensen will have surgery at 1200 today. Deneis bleeding, rates pain 7-8/10. Last food drink at 0610

## 2012-02-24 NOTE — MAU Note (Signed)
Dr Cherly Hensen at bedside discussing plan of care.

## 2012-02-24 NOTE — Transfer of Care (Signed)
Immediate Anesthesia Transfer of Care Note  Patient: Angela Washington  Procedure(s) Performed: Procedure(s) (LRB): LAPAROSCOPY OPERATIVE (Right) SALPINGO OOPHERECTOMY (Right)  Patient Location: PACU  Anesthesia Type: General  Level of Consciousness: awake and alert   Airway & Oxygen Therapy: Patient Spontanous Breathing and Patient connected to nasal cannula oxygen  Post-op Assessment: Report given to PACU RN, Post -op Vital signs reviewed and stable and Patient moving all extremities X 4  Post vital signs: Reviewed and stable  Complications: No apparent anesthesia complications

## 2012-02-24 NOTE — Brief Op Note (Signed)
02/24/2012  6:09 PM  PATIENT:  Angela Washington  46 y.o. female  PRE-OPERATIVE DIAGNOSIS:  Right Ovarian Torsion, RLQ pain  POST-OPERATIVE DIAGNOSIS:  Right Ovarian Torsion, RLQ pain, Anterior abdominal wall adhesion  PROCEDURE:  Procedure(s) (LRB): LAPAROSCOPY OPERATIVE (Right) SALPINGO OOPHERECTOMY (Right), LEFT SALPINGECTOMY, LYSIS OF ADHESION  SURGEON:  Surgeon(s) and Role:    * Lonni Dirden A Breyden Jeudy, MD - Primary    * Lenoard Aden, MD - Assisting  PHYSICIAN ASSISTANT:   ASSISTANTS: Olivia Mackie   ANESTHESIA:   general FINDINGS; sl enlarged uterus, nl liver edge, right tube surgically separated, right ovary w/ ovarian cysts, left tube with ovarian cyst, left tube w/ proximal hydrosalpinx, appendix surgically absent, omentum adherent to periumbilical site  EBL:  Total I/O In: 3034 [I.V.:2400; Blood:634] Out: 525 [Urine:500; Blood:25]  BLOOD ADMINISTERED:none  DRAINS: none   LOCAL MEDICATIONS USED:  MARCAINE     SPECIMEN:  Source of Specimen:  right tube and ovary, left tube  DISPOSITION OF SPECIMEN:  PATHOLOGY  COUNTS:  YES  TOURNIQUET:  * No tourniquets in log *  DICTATION: .Other Dictation: Dictation Number   PLAN OF CARE: Admit for overnight observation  PATIENT DISPOSITION:  PACU - hemodynamically stable.   Delay start of Pharmacological VTE agent (>24hrs) due to surgical blood loss or risk of bleeding: no

## 2012-02-24 NOTE — Anesthesia Postprocedure Evaluation (Signed)
Anesthesia Post Note  Patient: Angela Washington  Procedure(s) Performed: Procedure(s) (LRB): LAPAROSCOPY OPERATIVE (Right) SALPINGO OOPHERECTOMY (Right)  Anesthesia type: General  Patient location: PACU  Post pain: Pain level controlled  Post assessment: Post-op Vital signs reviewed  Last Vitals:  Filed Vitals:   02/24/12 1800  BP: 119/68  Pulse:   Temp: 36.3 C  Resp:     Post vital signs: Reviewed  Level of consciousness: sedated  Complications: No apparent anesthesia complicationsfj

## 2012-02-25 ENCOUNTER — Encounter (HOSPITAL_COMMUNITY): Payer: Self-pay | Admitting: Obstetrics and Gynecology

## 2012-02-25 DIAGNOSIS — N83519 Torsion of ovary and ovarian pedicle, unspecified side: Secondary | ICD-10-CM | POA: Diagnosis present

## 2012-02-25 DIAGNOSIS — R1031 Right lower quadrant pain: Secondary | ICD-10-CM | POA: Diagnosis present

## 2012-02-25 LAB — PREPARE FRESH FROZEN PLASMA: Unit division: 0

## 2012-02-25 LAB — CBC
HCT: 39.1 % (ref 36.0–46.0)
Hemoglobin: 13.2 g/dL (ref 12.0–15.0)
MCH: 29.9 pg (ref 26.0–34.0)
MCV: 88.7 fL (ref 78.0–100.0)
RBC: 4.41 MIL/uL (ref 3.87–5.11)

## 2012-02-25 MED ORDER — HYDROCODONE-ACETAMINOPHEN 5-325 MG PO TABS: 2.0000 | ORAL_TABLET | ORAL | Status: AC | PRN

## 2012-02-25 NOTE — Op Note (Signed)
Angela [place], Angela [place] NO.:  [id number]  MEDICAL RECORD NO.:  [id number]  LOCATION:  9317                          FACILITY:  [facility]  PHYSICIAN:  Maxie Better, M.D.DATE OF BIRTH:  05/06/1966  DATE OF PROCEDURE:  02/24/2012 DATE OF DISCHARGE:                              OPERATIVE REPORT   PREOPERATIVE DIAGNOSIS:  Right lower quadrant pain, right ovarian torsion.  PROCEDURE:  Operative open laparoscopy, right salpingo-oophorectomy, left salpingectomy, lysis of adhesions.  POSTOPERATIVE DIAGNOSES:  Right lower quadrant pain, right ovarian Torsion, bilateral ovarian cyst, anterior abdominal wall adhesion.  ANESTHESIA:  General.  SURGEON:  Maxie Better, MD  ASSISTANT:  Lenoard Aden, MD  PROCEDURE:  Under adequate general anesthesia, the patient was placed in a dorsal lithotomy position.  She was sterilely prepped and draped in usual fashion.  Examination under anesthesia revealed a slightly enlarged anteverted uterus.  Right adnexal fullness was noted.  Left Adnexa had no palpable mass.  An indwelling Foley catheter was sterilely placed.  Bivalve speculum was placed in the vagina.  Single-tooth tenaculum was placed on the anterior lip of the cervix.  An acorn cannula was introduced into the cervical os and attached to the tenaculum for manipulation of the uterus.  Bivalve speculum was then removed.  Attention was then turned to the abdomen.  A 0.25% Marcaine was injected along the previous infraumbilical skin incision.  Skin incision was then made through the previous scar, carried down sharply until the rectus fascia was revealed.  The rectus fascia was opened transversely.  A fatty tissue was noted at that point.  The parietal peritoneum was bluntly opened.  The Hasson camera was introduced into the incision and using a gel port Hassan cannula. A lighted video laparoscope was introduced into the abdomen.  Atraumatic entry into the abdomen  was noted.  There was omental adhesion to the left of the umbilical incision, however, the pelvis could be seen.  The abdomen was inspected. The liver edge was noted to be normal.  The pelvis was inspected. A bulky uterus was noted.  No obvious fluid collection in the pelvis.  Using manipulation of the uterine apparatus, the posterior pelvis was attempted to be identified but there was minimal manipulation of the instrument.  A 0.25% Marcaine was injected in the right and left lower quadrant. Incisions were made in those sites and 5-mm ports were placed under direct visualization.  Using a probe, the pelvis was inspected.  The surgical separation of both fallopian tubes was noted.  The left tube's proximal portion had some hydrosalpinx presentation.  The left ovary had what appeared to be a clear appearing cyst on its ovary but otherwise with some  Small amount of adhesions to the sidewall but was otherwise normal.  The right ovary was enlarged with two ovarian cyst.  The tube and ovary were not twisted at that point but the fallopian tube was elongated and  Surgical separation of tube noted which would cause intermittent torsion. The appendix had been surgically removed.  The posterior cul-de-sac was without any lesions. Given the recurrence of this pain, decision was made to remove the right tube and ovary.  Using  the gyrus, the  Right fallopian tube was grasped.  The ureter on the right was seen peristalsing away from the adnexa.  Serial clamping and cautery cutting resulted in the right tube and ovary both being removed.  On the opposite side, the left tube at the proximal portion was initially removed and then the distal portion was subsequently removed.  Using the same technique, the left mesosalpinx was serially clamped, cauterized, and cut.  Once that was done, the abdomen was irrigated.  Bleeding was noted at the incision sites where the tissue had been removed, recauterization was  done on both sides with good hemostasis.  The abdomen was partially deflated to check for bleeding.  When good hemostasis was noted, the abdomen was irrigated and suctioned.  Attention was then turned to removal of the specimen.  The left tube pieces were brought up through the umbilical site.  A 5-mm camera was then placed in the right lower quadrant and an endobag was placed through the umbilical site.  The right tube and ovary which had been removed was placed in the bag  And brought up through the umbilical port site.  The cyst was punctured in the bag and the tube and ovary were removed through the bag. Once that was done, the Hasson cannula was then reinserted. Still using the 5 mm scope through the right lower port site, the omental adhesions was dissected off the anterior abdominal wall with serial clamping, Cauterization and cut.  Once that was done, the omental site was inspected.  The anterior abdominal wall was inspected.  Good hemostasis noted to both omentum and abdominal wall. The abdomen was once again irrigated and suctioned.  Once the procedure was completed, the lower ports were removed.  The abdomen deflated.  The infraumbilical site port was also removed taking care not to bring up any underlying structures.  Interrupted 0 Vicryl figure-of-eight sutures was placed to close the fascia.  The skin incisions were then closed with subcuticular 4-0 Vicryl sutures.  The instruments in the vagina was removed.  SPECIMEN:  Right tube and ovary, and left fallopian tube sent to pathology.  ESTIMATED BLOOD LOSS:  25 mL.  INTRAOPERATIVE FLUID:  1900 mL.  URINE OUTPUT:  500 mL clear urine.  Sponge and instrument counts x2 was correct.  COMPLICATION:  None.  The patient tolerated the procedure well, was transferred to the recovery room in stable condition.     Maxie Better, M.D.     [place]/MEDQ  D:  02/25/2012  T:  02/25/2012  Job:  [id number]

## 2012-02-25 NOTE — Discharge Summary (Signed)
Physician Discharge Summary  Patient ID: Angela [place] MRN: [id number] DOB/AGE: November 11, 1965 46 y.o.  Admit date: 02/24/2012 Discharge date: 02/25/2012  Admission Diagnoses: RLQ pain, right ovarian torsion, chronic anticoagulation  Discharge Diagnoses: RLQ pain, anterior abdominal wall adhesion, intermittent right ovarian torsion Principal Problem:  *Ovarian torsion Active Problems:  RLQ abdominal pain   Discharged Condition: stable  Hospital Course: pt was sent from office due to sonographic evidence of right ovarian torsion.  Due to chronic anticoagulation, PT/PTT and INR were ordered. Intravenous pain med given. Due to timing of last meal, patient 's surgery was delayed. The INR however returned as 3.0. Heme/Onc was called regarding reversal of coumadin dose( pt had last taken med the morning prior). She was transferred to [facility] for Vitamin K( 10 mg IV) as well as 4 units of FFP per Dr Alver Fisher recommendation. Once this was achieved, the pt was taken to OR for surgical management. She had been placed on dilaudid PCA for pain management while awaiting surgery. Pt was taken to OR where she underwent open operative laparoscopy with RSO, left salpingectomy and removal of omental adhesion to anterior abdominal wall. See op note for details. Postop course was unremarkable. The patient was started on lovenox the next day to bridge resumption of coumadin. I had also spoken to Dr. Allyson Sabal, the patient's cardiologist regarding mgmt of coumadin postoperatively.  Consults: None( formally)  Significant Diagnostic Studies: labs: wbc 16K, hgb 13.2 hct 39.1,, plt 200k. INR 1.13  Treatments: antibiotics: Ancef, anticoagulation: LMW heparin and surgery: operative laparoscopy, RSO, left salpingectomy, omental adhesion lysed.   Discharge Exam: Blood pressure 98/59, pulse 80, temperature 98 F (36.7 C), temperature source Oral, resp. rate 16, height 5\' 4"  (1.626 m), weight 79.833 kg (176 lb), SpO2 95.00%. General  appearance: alert, cooperative and no distress Resp: clear to auscultation bilaterally Cardio: regular rate and rhythm, S1, S2 normal, no murmur, click, rub or gallop GI: soft nondistended, incisions well approximated, min tender Incision/Wound: Well approximated Disposition: home  Discharge Orders    Future Orders Please Complete By Expires   Diet - low sodium heart healthy      Discharge instructions      Comments:   CALL  IF TEMP>100.4, NOTHING PER VAGINA X 2 WK, CALL IF SOAKING A MAXI  PAD EVERY HOUR OR MORE FREQUENTLY   May walk up steps        Medication List  As of 02/25/2012 12:49 PM   TAKE these medications         cholecalciferol 1000 UNITS tablet   Commonly known as: VITAMIN D   Take 2,000 Units by mouth daily. Pt takes 2,000 units daily.      folic acid 1 MG tablet   Commonly known as: FOLVITE   Take 1 mg by mouth daily.      HYDROcodone-acetaminophen 5-325 MG per tablet   Commonly known as: NORCO/VICODIN   Take 2 tablets by mouth every 4 (four) hours as needed.      rizatriptan 10 MG tablet   Commonly known as: MAXALT   Take 10 mg by mouth as needed. May repeat in 2 hours if needed for migraines.      spironolactone 25 MG tablet   Commonly known as: ALDACTONE   Take 25 mg by mouth daily.      warfarin 5 MG tablet   Commonly known as: COUMADIN   Take 5 mg by mouth every other day. Takes 5mg  T-TH-Sat      warfarin  2.5 MG tablet   Commonly known as: COUMADIN   Take 2.5 mg by mouth every other day. Takes 2.5 M-W-F-SUN          lovenox 80 mg [place]ky bid. Restart coumadin 5 mg daily tonight and recheck INR Monday Follow-up Information    Follow up with Karrington Mccravy A, MD on 03/08/2012.   Contact information:   [address] [place] [address] [phone]          Signed: Serita Kyle 02/25/2012, 12:49 PM

## 2012-02-28 MED FILL — Heparin Sodium (Porcine) Inj 5000 Unit/ML: INTRAMUSCULAR | Qty: 1 | Status: AC

## 2013-08-06 ENCOUNTER — Encounter (HOSPITAL_COMMUNITY): Payer: Self-pay | Admitting: Pharmacist

## 2013-08-08 ENCOUNTER — Other Ambulatory Visit: Payer: Self-pay | Admitting: Obstetrics and Gynecology

## 2013-08-13 NOTE — Patient Instructions (Addendum)
   Your procedure is scheduled on:  Wednesday, Jan 28  Enter through the [employer] [facility] at: 6 AM Pick up the phone at the desk and dial [phone] and inform us of your arrival.  Please call this number if you have any problems the morning of surgery: [phone]  Remember: Do not eat or  Drink after midnight: Tuesday Take these medicines the morning of surgery with a SIP OF WATER:  Aldactone.  Patient will get instructions on how to take her coumadin/lovenax.   Do not wear jewelry, make-up, or FINGER nail polish No metal in your hair or on your body.   Do not wear lotions, powders, perfumes.  You may wear deodorant.  Do not bring valuables to the hospital. Contacts, dentures or bridgework may not be worn into surgery.  Patients discharged on the day of surgery will not be allowed to drive home.  For patients being admitted to the hospital, checkout time is 11:00am the day of discharge. Leave suitcase in the car. After Surgery it may be brought to your room.   Home with husband Kyung Rudd cell [phone].

## 2013-08-14 ENCOUNTER — Encounter (HOSPITAL_COMMUNITY)
Admission: RE | Admit: 2013-08-14 | Discharge: 2013-08-14 | Disposition: A | Discharge status: Home / Self Care | Payer: BC Managed Care – PPO | Source: Ambulatory Visit | Attending: Obstetrics and Gynecology | Admitting: Obstetrics and Gynecology

## 2013-08-14 ENCOUNTER — Encounter (HOSPITAL_COMMUNITY): Payer: Self-pay

## 2013-08-14 DIAGNOSIS — Z01818 Encounter for other preprocedural examination: Secondary | ICD-10-CM | POA: Insufficient documentation

## 2013-08-14 DIAGNOSIS — Z01812 Encounter for preprocedural laboratory examination: Secondary | ICD-10-CM | POA: Insufficient documentation

## 2013-08-14 HISTORY — DX: Nausea with vomiting, unspecified: Z98.890

## 2013-08-14 HISTORY — DX: Essential (primary) hypertension: I10

## 2013-08-14 HISTORY — DX: Headache: R51

## 2013-08-14 HISTORY — DX: Nausea with vomiting, unspecified: R11.2

## 2013-08-14 HISTORY — DX: Peripheral vascular disease, unspecified: I73.9

## 2013-08-14 LAB — BASIC METABOLIC PANEL
BUN: 16 mg/dL (ref 6–23)
CALCIUM: 9.3 mg/dL (ref 8.4–10.5)
CO2: 29 meq/L (ref 19–32)
CREATININE: 0.69 mg/dL (ref 0.50–1.10)
Chloride: 101 mEq/L (ref 96–112)
GFR calc Af Amer: >90 mL/min (ref >90)
GFR calc non Af Amer: >90 mL/min (ref >90)
Glucose, Bld: 115 mg/dL — ABNORMAL HIGH (ref 70–99)
Potassium: 4.2 mEq/L (ref 3.7–5.3)
Sodium: 140 mEq/L (ref 137–147)

## 2013-08-14 LAB — CBC
HCT: 44.4 % (ref 36.0–46.0)
Hemoglobin: 15.7 g/dL — ABNORMAL HIGH (ref 12.0–15.0)
MCH: 30.1 pg (ref 26.0–34.0)
MCHC: 35.4 g/dL (ref 30.0–36.0)
MCV: 85.1 fL (ref 78.0–100.0)
PLATELETS: 267 10*3/uL (ref 150–400)
RBC: 5.22 MIL/uL — ABNORMAL HIGH (ref 3.87–5.11)
RDW: 12.1 % (ref 11.5–15.5)
WBC: 6.7 10*3/uL (ref 4.0–10.5)

## 2013-08-14 NOTE — Pre-Procedure Instructions (Signed)
Patient is a Chief Executive Officer for [employer].  Patient states she checks her INR daily at home.  No MD orders for PT/PTT.  Patient will get instructions from her PCP regarding coumadin/lovenox.

## 2013-08-22 ENCOUNTER — Ambulatory Visit (HOSPITAL_COMMUNITY): Payer: BC Managed Care – PPO | Admitting: Anesthesiology

## 2013-08-22 ENCOUNTER — Encounter (HOSPITAL_COMMUNITY): Payer: Self-pay | Admitting: *Deleted

## 2013-08-22 ENCOUNTER — Encounter (HOSPITAL_COMMUNITY): Payer: BC Managed Care – PPO | Admitting: Anesthesiology

## 2013-08-22 ENCOUNTER — Encounter (HOSPITAL_COMMUNITY)
Admission: RE | Disposition: A | Discharge status: Home / Self Care | Payer: Self-pay | Source: Ambulatory Visit | Attending: Obstetrics and Gynecology

## 2013-08-22 ENCOUNTER — Ambulatory Visit (HOSPITAL_COMMUNITY)
Admission: RE | Admit: 2013-08-22 | Discharge: 2013-08-23 | Disposition: A | Discharge status: Home / Self Care | Payer: BC Managed Care – PPO | Source: Ambulatory Visit | Attending: Obstetrics and Gynecology | Admitting: Obstetrics and Gynecology

## 2013-08-22 DIAGNOSIS — N8000 Endometriosis of the uterus, unspecified: Secondary | ICD-10-CM | POA: Insufficient documentation

## 2013-08-22 DIAGNOSIS — Z86711 Personal history of pulmonary embolism: Secondary | ICD-10-CM | POA: Insufficient documentation

## 2013-08-22 DIAGNOSIS — N938 Other specified abnormal uterine and vaginal bleeding: Secondary | ICD-10-CM | POA: Insufficient documentation

## 2013-08-22 DIAGNOSIS — Z90710 Acquired absence of both cervix and uterus: Secondary | ICD-10-CM

## 2013-08-22 DIAGNOSIS — N949 Unspecified condition associated with female genital organs and menstrual cycle: Secondary | ICD-10-CM | POA: Insufficient documentation

## 2013-08-22 DIAGNOSIS — D250 Submucous leiomyoma of uterus: Secondary | ICD-10-CM | POA: Insufficient documentation

## 2013-08-22 HISTORY — PX: ROBOTIC ASSISTED TOTAL HYSTERECTOMY: SHX6085

## 2013-08-22 SURGERY — ROBOTIC ASSISTED TOTAL HYSTERECTOMY
Anesthesia: General | Site: Abdomen

## 2013-08-22 MED ORDER — FENTANYL CITRATE 0.05 MG/ML IJ SOLN
INTRAMUSCULAR | Status: AC
  Filled 2013-08-22: qty 2

## 2013-08-22 MED ORDER — ONDANSETRON HCL 4 MG/2ML IJ SOLN: 4.0000 mg | Freq: Four times a day (QID) | INTRAMUSCULAR | Status: DC | PRN

## 2013-08-22 MED ORDER — OXYCODONE-ACETAMINOPHEN 5-325 MG PO TABS
1.0000 | ORAL_TABLET | ORAL | Status: DC | PRN
Start: 2013-08-22 — End: 2013-08-23
  Administered 2013-08-22 – 2013-08-23 (×4): 2 via ORAL
  Filled 2013-08-22: qty 1
  Filled 2013-08-22 (×4): qty 2

## 2013-08-22 MED ORDER — ONDANSETRON HCL 4 MG/2ML IJ SOLN
INTRAMUSCULAR | Status: AC
  Filled 2013-08-22: qty 2

## 2013-08-22 MED ORDER — DEXAMETHASONE SODIUM PHOSPHATE 10 MG/ML IJ SOLN
INTRAMUSCULAR | Status: DC | PRN
  Administered 2013-08-22: 10 mg via INTRAVENOUS

## 2013-08-22 MED ORDER — BUPIVACAINE HCL (PF) 0.25 % IJ SOLN
INTRAMUSCULAR | Status: AC
  Filled 2013-08-22: qty 30

## 2013-08-22 MED ORDER — MIDAZOLAM HCL 2 MG/2ML IJ SOLN
INTRAMUSCULAR | Status: DC | PRN
  Administered 2013-08-22: 2 mg via INTRAVENOUS

## 2013-08-22 MED ORDER — METOCLOPRAMIDE HCL 5 MG/ML IJ SOLN
INTRAMUSCULAR | Status: AC
  Filled 2013-08-22: qty 2

## 2013-08-22 MED ORDER — HYDROMORPHONE HCL PF 1 MG/ML IJ SOLN
INTRAMUSCULAR | Status: AC
  Filled 2013-08-22: qty 1

## 2013-08-22 MED ORDER — FENTANYL CITRATE 0.05 MG/ML IJ SOLN
25.0000 ug | INTRAMUSCULAR | Status: DC | PRN
  Administered 2013-08-22: 50 ug via INTRAVENOUS

## 2013-08-22 MED ORDER — METOPROLOL SUCCINATE ER 50 MG PO TB24
50.0000 mg | ORAL_TABLET | Freq: Every day | ORAL | Status: DC
  Filled 2013-08-22: qty 1

## 2013-08-22 MED ORDER — SODIUM CHLORIDE 0.9 % IJ SOLN
INTRAMUSCULAR | Status: AC
  Filled 2013-08-22: qty 10

## 2013-08-22 MED ORDER — LIDOCAINE HCL (CARDIAC) 20 MG/ML IV SOLN
INTRAVENOUS | Status: DC | PRN
  Administered 2013-08-22: 80 mg via INTRAVENOUS

## 2013-08-22 MED ORDER — PHENYLEPHRINE 40 MCG/ML (10ML) SYRINGE FOR IV PUSH (FOR BLOOD PRESSURE SUPPORT)
PREFILLED_SYRINGE | INTRAVENOUS | Status: AC
  Filled 2013-08-22: qty 10

## 2013-08-22 MED ORDER — MEPERIDINE HCL 25 MG/ML IJ SOLN: 6.2500 mg | INTRAMUSCULAR | Status: DC | PRN

## 2013-08-22 MED ORDER — LIDOCAINE HCL (CARDIAC) 20 MG/ML IV SOLN
INTRAVENOUS | Status: AC
  Filled 2013-08-22: qty 5

## 2013-08-22 MED ORDER — SCOPOLAMINE 1 MG/3DAYS TD PT72
1.0000 | MEDICATED_PATCH | TRANSDERMAL | Status: DC
  Administered 2013-08-22: 1.5 mg via TRANSDERMAL

## 2013-08-22 MED ORDER — HYDROMORPHONE HCL PF 1 MG/ML IJ SOLN
INTRAMUSCULAR | Status: DC | PRN
  Administered 2013-08-22 (×2): 1 mg via INTRAVENOUS

## 2013-08-22 MED ORDER — KETOROLAC TROMETHAMINE 30 MG/ML IJ SOLN
INTRAMUSCULAR | Status: AC
  Administered 2013-08-22: 30 mg via INTRAVENOUS
  Filled 2013-08-22: qty 1

## 2013-08-22 MED ORDER — NEOSTIGMINE METHYLSULFATE 1 MG/ML IJ SOLN
INTRAMUSCULAR | Status: AC
  Filled 2013-08-22: qty 1

## 2013-08-22 MED ORDER — SCOPOLAMINE 1 MG/3DAYS TD PT72
MEDICATED_PATCH | TRANSDERMAL | Status: AC
  Filled 2013-08-22: qty 1

## 2013-08-22 MED ORDER — METOCLOPRAMIDE HCL 5 MG/ML IJ SOLN
INTRAMUSCULAR | Status: DC | PRN
  Administered 2013-08-22: 10 mg via INTRAVENOUS

## 2013-08-22 MED ORDER — GLYCOPYRROLATE 0.2 MG/ML IJ SOLN
INTRAMUSCULAR | Status: AC
  Filled 2013-08-22: qty 4

## 2013-08-22 MED ORDER — MENTHOL 3 MG MT LOZG: 1.0000 | LOZENGE | OROMUCOSAL | Status: DC | PRN

## 2013-08-22 MED ORDER — SPIRONOLACTONE 25 MG PO TABS
25.0000 mg | ORAL_TABLET | Freq: Every day | ORAL | Status: DC
  Administered 2013-08-22: 25 mg via ORAL
  Filled 2013-08-22 (×2): qty 1

## 2013-08-22 MED ORDER — CEFAZOLIN SODIUM-DEXTROSE 2-3 GM-% IV SOLR
INTRAVENOUS | Status: AC
  Filled 2013-08-22: qty 50

## 2013-08-22 MED ORDER — ONDANSETRON HCL 4 MG PO TABS: 4.0000 mg | ORAL_TABLET | Freq: Four times a day (QID) | ORAL | Status: DC | PRN

## 2013-08-22 MED ORDER — FENTANYL CITRATE 0.05 MG/ML IJ SOLN
INTRAMUSCULAR | Status: DC | PRN
  Administered 2013-08-22 (×6): 50 ug via INTRAVENOUS
  Administered 2013-08-22: 100 ug via INTRAVENOUS

## 2013-08-22 MED ORDER — NEOSTIGMINE METHYLSULFATE 1 MG/ML IJ SOLN
INTRAMUSCULAR | Status: DC | PRN
  Administered 2013-08-22: 3 mg via INTRAVENOUS

## 2013-08-22 MED ORDER — PHENYLEPHRINE HCL 10 MG/ML IJ SOLN
INTRAMUSCULAR | Status: DC | PRN
  Administered 2013-08-22 (×3): 40 ug via INTRAVENOUS
  Administered 2013-08-22 (×2): 80 ug via INTRAVENOUS
  Administered 2013-08-22: 40 ug via INTRAVENOUS

## 2013-08-22 MED ORDER — PROMETHAZINE HCL 25 MG/ML IJ SOLN: 6.2500 mg | INTRAMUSCULAR | Status: DC | PRN

## 2013-08-22 MED ORDER — LACTATED RINGERS IV SOLN
INTRAVENOUS | Status: DC
  Administered 2013-08-22 (×3): via INTRAVENOUS

## 2013-08-22 MED ORDER — CEFAZOLIN SODIUM-DEXTROSE 2-3 GM-% IV SOLR
2.0000 g | INTRAVENOUS | Status: AC
  Administered 2013-08-22: 2 g via INTRAVENOUS

## 2013-08-22 MED ORDER — ROCURONIUM BROMIDE 100 MG/10ML IV SOLN
INTRAVENOUS | Status: AC
  Filled 2013-08-22: qty 1

## 2013-08-22 MED ORDER — GLYCOPYRROLATE 0.2 MG/ML IJ SOLN
INTRAMUSCULAR | Status: DC | PRN
  Administered 2013-08-22: 0.1 mg via INTRAVENOUS
  Administered 2013-08-22: .6 mg via INTRAVENOUS

## 2013-08-22 MED ORDER — BUPIVACAINE HCL (PF) 0.25 % IJ SOLN
INTRAMUSCULAR | Status: DC | PRN
  Administered 2013-08-22: 1 mL

## 2013-08-22 MED ORDER — LACTATED RINGERS IR SOLN
Status: DC | PRN
  Administered 2013-08-22: 3000 mL

## 2013-08-22 MED ORDER — ACETAMINOPHEN 10 MG/ML IV SOLN
1000.0000 mg | Freq: Once | INTRAVENOUS | Status: DC
  Filled 2013-08-22: qty 100

## 2013-08-22 MED ORDER — KETOROLAC TROMETHAMINE 30 MG/ML IJ SOLN: 30.0000 mg | Freq: Four times a day (QID) | INTRAMUSCULAR | Status: DC

## 2013-08-22 MED ORDER — MIDAZOLAM HCL 2 MG/2ML IJ SOLN
INTRAMUSCULAR | Status: AC
  Filled 2013-08-22: qty 2

## 2013-08-22 MED ORDER — DEXTROSE IN LACTATED RINGERS 5 % IV SOLN
INTRAVENOUS | Status: DC
  Administered 2013-08-22: 18:00:00 via INTRAVENOUS

## 2013-08-22 MED ORDER — KETOROLAC TROMETHAMINE 30 MG/ML IJ SOLN
15.0000 mg | Freq: Once | INTRAMUSCULAR | Status: AC | PRN
  Administered 2013-08-22: 30 mg via INTRAVENOUS

## 2013-08-22 MED ORDER — PROPOFOL 10 MG/ML IV EMUL
INTRAVENOUS | Status: AC
  Filled 2013-08-22: qty 20

## 2013-08-22 MED ORDER — ROPIVACAINE HCL 5 MG/ML IJ SOLN
INTRAMUSCULAR | Status: AC
  Filled 2013-08-22: qty 60

## 2013-08-22 MED ORDER — ACETAMINOPHEN 10 MG/ML IV SOLN
INTRAVENOUS | Status: DC | PRN
  Administered 2013-08-22: 1000 mg via INTRAVENOUS

## 2013-08-22 MED ORDER — ENOXAPARIN SODIUM 80 MG/0.8ML ~~LOC~~ SOLN
80.0000 mg | Freq: Two times a day (BID) | SUBCUTANEOUS | Status: DC
  Filled 2013-08-22: qty 0.8

## 2013-08-22 MED ORDER — PANTOPRAZOLE SODIUM 40 MG PO TBEC
40.0000 mg | DELAYED_RELEASE_TABLET | Freq: Every day | ORAL | Status: DC
  Administered 2013-08-22: 40 mg via ORAL
  Filled 2013-08-22 (×2): qty 1

## 2013-08-22 MED ORDER — ROPIVACAINE HCL 5 MG/ML IJ SOLN
INTRAMUSCULAR | Status: DC | PRN
  Administered 2013-08-22: 60 mL via EPIDURAL

## 2013-08-22 MED ORDER — KETOROLAC TROMETHAMINE 30 MG/ML IJ SOLN
30.0000 mg | Freq: Four times a day (QID) | INTRAMUSCULAR | Status: DC
  Administered 2013-08-22: 30 mg via INTRAVENOUS
  Filled 2013-08-22: qty 1

## 2013-08-22 MED ORDER — SODIUM CHLORIDE 0.9 % IJ SOLN
INTRAMUSCULAR | Status: AC
  Filled 2013-08-22: qty 50

## 2013-08-22 MED ORDER — FENTANYL CITRATE 0.05 MG/ML IJ SOLN
INTRAMUSCULAR | Status: AC
  Administered 2013-08-22: 50 ug via INTRAVENOUS
  Filled 2013-08-22: qty 2

## 2013-08-22 MED ORDER — ARTIFICIAL TEARS OP OINT
TOPICAL_OINTMENT | OPHTHALMIC | Status: DC | PRN
  Administered 2013-08-22: 1 via OPHTHALMIC

## 2013-08-22 MED ORDER — FENTANYL CITRATE 0.05 MG/ML IJ SOLN
INTRAMUSCULAR | Status: AC
  Filled 2013-08-22: qty 5

## 2013-08-22 MED ORDER — ZOLPIDEM TARTRATE 5 MG PO TABS: 5.0000 mg | ORAL_TABLET | Freq: Every evening | ORAL | Status: DC | PRN

## 2013-08-22 MED ORDER — DEXAMETHASONE SODIUM PHOSPHATE 10 MG/ML IJ SOLN
INTRAMUSCULAR | Status: AC
  Filled 2013-08-22: qty 1

## 2013-08-22 MED ORDER — PROPOFOL 10 MG/ML IV BOLUS
INTRAVENOUS | Status: DC | PRN
  Administered 2013-08-22: 30 mg via INTRAVENOUS
  Administered 2013-08-22: 200 mg via INTRAVENOUS

## 2013-08-22 MED ORDER — ROCURONIUM BROMIDE 100 MG/10ML IV SOLN
INTRAVENOUS | Status: DC | PRN
  Administered 2013-08-22: 20 mg via INTRAVENOUS
  Administered 2013-08-22: 45 mg via INTRAVENOUS
  Administered 2013-08-22: 5 mg via INTRAVENOUS
  Administered 2013-08-22: 10 mg via INTRAVENOUS
  Administered 2013-08-22: 5 mg via INTRAVENOUS
  Administered 2013-08-22: 10 mg via INTRAVENOUS
  Administered 2013-08-22: 5 mg via INTRAVENOUS
  Administered 2013-08-22 (×3): 10 mg via INTRAVENOUS

## 2013-08-22 SURGICAL SUPPLY — 73 items
ADH SKN CLS APL DERMABOND .7 (GAUZE/BANDAGES/DRESSINGS) ×1
BAG URINE DRAINAGE (UROLOGICAL SUPPLIES) ×2 IMPLANT
BARRIER ADHS 3X4 INTERCEED (GAUZE/BANDAGES/DRESSINGS) ×2 IMPLANT
BRR ADH 4X3 ABS CNTRL BYND (GAUZE/BANDAGES/DRESSINGS) ×1
CATH FOLEY 3WAY  5CC 16FR (CATHETERS) ×1
CATH FOLEY 3WAY 5CC 16FR (CATHETERS) ×1 IMPLANT
CHLORAPREP W/TINT 26ML (MISCELLANEOUS) ×2 IMPLANT
CLOTH BEACON ORANGE TIMEOUT ST (SAFETY) ×2 IMPLANT
CONT PATH 16OZ SNAP LID 3702 (MISCELLANEOUS) ×2 IMPLANT
CORDS BIPOLAR (ELECTRODE) ×1 IMPLANT
COVER MAYO STAND STRL (DRAPES) ×2 IMPLANT
COVER TABLE BACK 60X90 (DRAPES) ×4 IMPLANT
COVER TIP SHEARS 8 DVNC (MISCELLANEOUS) ×1 IMPLANT
COVER TIP SHEARS 8MM DA VINCI (MISCELLANEOUS) ×1
DECANTER SPIKE VIAL GLASS SM (MISCELLANEOUS) ×2 IMPLANT
DERMABOND ADVANCED (GAUZE/BANDAGES/DRESSINGS) ×1
DERMABOND ADVANCED .7 DNX12 (GAUZE/BANDAGES/DRESSINGS) ×1 IMPLANT
DEVICE TROCAR PUNCTURE CLOSURE (ENDOMECHANICALS) IMPLANT
DRAPE HUG U DISPOSABLE (DRAPE) ×2 IMPLANT
DRAPE LG THREE QUARTER DISP (DRAPES) ×4 IMPLANT
DRAPE WARM FLUID 44X44 (DRAPE) ×2 IMPLANT
DRSG TEGADERM 2.38X2.75 (GAUZE/BANDAGES/DRESSINGS) ×1 IMPLANT
ELECT REM PT RETURN 9FT ADLT (ELECTROSURGICAL) ×2
ELECTRODE REM PT RTRN 9FT ADLT (ELECTROSURGICAL) ×1 IMPLANT
EVACUATOR SMOKE 8.L (FILTER) ×2 IMPLANT
FORCEPS BI-POLAR FEN SS 5MM (FORCEP) ×2 IMPLANT
FORCEPS MARYLAND BIPOLAR 8X55 (INSTRUMENTS) ×1
FORCEPS MRYLND BPLR 8X55 DVNC (INSTRUMENTS) IMPLANT
GAUZE SPONGE 4X4 12PLY STRL LF (GAUZE/BANDAGES/DRESSINGS) ×1 IMPLANT
GAUZE VASELINE 3X9 (GAUZE/BANDAGES/DRESSINGS) IMPLANT
GLOVE BIOGEL PI IND STRL 7.0 (GLOVE) ×2 IMPLANT
GLOVE BIOGEL PI INDICATOR 7.0 (GLOVE) ×2
GLOVE ECLIPSE 6.5 STRL STRAW (GLOVE) ×2 IMPLANT
GOWN STRL REUS W/TWL LRG LVL3 (GOWN DISPOSABLE) ×12 IMPLANT
KIT ACCESSORY DA VINCI DISP (KITS) ×1
KIT ACCESSORY DVNC DISP (KITS) ×1 IMPLANT
LEGGING LITHOTOMY PAIR STRL (DRAPES) ×2 IMPLANT
NDL INSUFFLATION 14GA 150MM (NEEDLE) ×1 IMPLANT
NEEDLE INSUFFLATION 14GA 150MM (NEEDLE) ×2 IMPLANT
OCCLUDER COLPOPNEUMO (BALLOONS) ×1 IMPLANT
PACK LAVH (CUSTOM PROCEDURE TRAY) ×2 IMPLANT
PAD PREP 24X48 CUFFED NSTRL (MISCELLANEOUS) ×4 IMPLANT
PLUG CATH AND CAP STER (CATHETERS) ×2 IMPLANT
PORT ENDOSCOPE SS 8.5MM (MISCELLANEOUS) ×1 IMPLANT
PROTECTOR NERVE ULNAR (MISCELLANEOUS) ×4 IMPLANT
SCISSORS LAP 5X35 DISP (ENDOMECHANICALS) IMPLANT
SET CYSTO W/LG BORE CLAMP LF (SET/KITS/TRAYS/PACK) ×1 IMPLANT
SET IRRIG TUBING LAPAROSCOPIC (IRRIGATION / IRRIGATOR) ×2 IMPLANT
SOLUTION ELECTROLUBE (MISCELLANEOUS) ×2 IMPLANT
SUT MNCRL AB 3-0 PS2 27 (SUTURE) ×1 IMPLANT
SUT PDS AB 0 CT1 27 (SUTURE) ×1 IMPLANT
SUT VIC AB 0 CT1 36 (SUTURE) ×4 IMPLANT
SUT VICRYL 0 UR6 27IN ABS (SUTURE) ×3 IMPLANT
SUT VICRYL 4-0 PS2 18IN ABS (SUTURE) ×2 IMPLANT
SUT VLOC 180 0 9IN  GS21 (SUTURE) ×2
SUT VLOC 180 0 9IN GS21 (SUTURE) ×1 IMPLANT
SYR 50ML LL SCALE MARK (SYRINGE) ×2 IMPLANT
SYRINGE 10CC LL (SYRINGE) ×2 IMPLANT
TIP RUMI ORANGE 6.7MMX12CM (TIP) IMPLANT
TIP UTERINE 5.1X6CM LAV DISP (MISCELLANEOUS) IMPLANT
TIP UTERINE 6.7X10CM GRN DISP (MISCELLANEOUS) IMPLANT
TIP UTERINE 6.7X6CM WHT DISP (MISCELLANEOUS) IMPLANT
TIP UTERINE 6.7X8CM BLUE DISP (MISCELLANEOUS) ×1 IMPLANT
TOWEL OR 17X24 6PK STRL BLUE (TOWEL DISPOSABLE) ×6 IMPLANT
TROCAR 12M 150ML BLUNT (TROCAR) IMPLANT
TROCAR DISP BLADELESS 8 DVNC (TROCAR) IMPLANT
TROCAR DISP BLADELESS 8MM (TROCAR) ×1
TROCAR XCEL 12X100 BLDLESS (ENDOMECHANICALS) ×2 IMPLANT
TROCAR XCEL NON-BLD 5MMX100MML (ENDOMECHANICALS) ×4 IMPLANT
TROCAR Z-THREAD 12X150 (TROCAR) ×2 IMPLANT
TUBING FILTER THERMOFLATOR (ELECTROSURGICAL) ×2 IMPLANT
WARMER LAPAROSCOPE (MISCELLANEOUS) ×2 IMPLANT
WATER STERILE IRR 1000ML POUR (IV SOLUTION) ×6 IMPLANT

## 2013-08-22 NOTE — Transfer of Care (Signed)
Immediate Anesthesia Transfer of Care Note  Patient: Angela [place]  Procedure(s) Performed: Procedure(s): ROBOTIC ASSISTED TOTAL HYSTERECTOMY/BILATERAL SALPINGECTOMY (N/A)  Patient Location: PACU  Anesthesia Type:General  Level of Consciousness: awake, alert  and oriented  Airway & Oxygen Therapy: Patient Spontanous Breathing and Patient connected to nasal cannula oxygen  Post-op Assessment: Report given to PACU RN and Post -op Vital signs reviewed and stable  Post vital signs: Reviewed and stable  Complications: No apparent anesthesia complications

## 2013-08-22 NOTE — Preoperative (Signed)
Beta Blockers   Reason not to administer Beta Blockers:Hold beta blocker due to other  The patient took her usual dose at 2100 on 08/21/2013 and medication dosed every 24 hours.

## 2013-08-22 NOTE — Anesthesia Postprocedure Evaluation (Signed)
  Anesthesia Post-op Note  Patient: Angela Washington  Procedure(s) Performed: Procedure(s): ROBOTIC ASSISTED TOTAL HYSTERECTOMY/BILATERAL SALPINGECTOMY (N/A)  Patient Location: [facility]  Anesthesia Type:General  Level of Consciousness: awake, alert , oriented and patient cooperative  Airway and Oxygen Therapy: Patient Spontanous Breathing  Post-op Pain: mild  Post-op Assessment: Patient's Cardiovascular Status Stable, Respiratory Function Stable, Patent Airway, No signs of Nausea or vomiting, Adequate PO intake and Pain level controlled  Post-op Vital Signs: Reviewed and stable  Complications: No apparent anesthesia complications

## 2013-08-22 NOTE — Anesthesia Postprocedure Evaluation (Signed)
  Anesthesia Post-op Note  Patient: Angela [place]  Procedure(s) Performed: Procedure(s): ROBOTIC ASSISTED TOTAL HYSTERECTOMY/BILATERAL SALPINGECTOMY (N/A) Patient is awake and responsive. Pain and nausea are reasonably well controlled. Vital signs are stable and clinically acceptable. Oxygen saturation is clinically acceptable. There are no apparent anesthetic complications at this time. Patient is ready for discharge.

## 2013-08-22 NOTE — Anesthesia Preprocedure Evaluation (Signed)
Anesthesia Evaluation  Patient identified by MRN, date of birth, ID band Patient awake    Reviewed: Allergy & Precautions, H&P , NPO status , Patient's Chart, lab work & pertinent test results, reviewed documented beta blocker date and time   History of Anesthesia Complications (+) PONV and history of anesthetic complications  Airway Mallampati: II TM Distance: >3 FB Neck ROM: full    Dental no notable dental hx. (+) Teeth Intact   Pulmonary neg pulmonary ROS,    Pulmonary exam normal       Cardiovascular hypertension, Pt. on medications and Pt. on home beta blockers     Neuro/Psych negative psych ROS   GI/Hepatic negative GI ROS, Neg liver ROS,   Endo/Other  negative endocrine ROS  Renal/GU negative Renal ROS     Musculoskeletal   Abdominal Normal abdominal exam  (+)   Peds  Hematology negative hematology ROS (+)   Anesthesia Other Findings   Reproductive/Obstetrics negative OB ROS                           Anesthesia Physical Anesthesia Plan  ASA: II  Anesthesia Plan: General   Post-op Pain Management:    Induction: Intravenous  Airway Management Planned: Oral ETT  Additional Equipment:   Intra-op Plan:   Post-operative Plan: Extubation in OR  Informed Consent: I have reviewed the patients History and Physical, chart, labs and discussed the procedure including the risks, benefits and alternatives for the proposed anesthesia with the patient or authorized representative who has indicated his/her understanding and acceptance.   Dental Advisory Given  Plan Discussed with: CRNA, Surgeon and Anesthesiologist  Anesthesia Plan Comments: (Grade III laryngoscopy on last GA here but was intubated by D. Education officer, community on 1st attempt. Scop disc for PONV.)        Anesthesia Quick Evaluation

## 2013-08-22 NOTE — Anesthesia Procedure Notes (Signed)
Procedure Name: Intubation Date/Time: 08/22/2013 7:28 AM Performed by: Flossie Dibble Pre-anesthesia Checklist: Patient identified, Timeout performed, Emergency Drugs available, Patient being monitored and Suction available Patient Re-evaluated:Patient Re-evaluated prior to inductionOxygen Delivery Method: Circle system utilized Preoxygenation: Pre-oxygenation with 100% oxygen Intubation Type: IV induction Ventilation: Mask ventilation without difficulty Laryngoscope Size: Mac and 3 Grade View: Grade I Tube type: Oral Tube size: 7.0 mm Number of attempts: 1 Airway Equipment and Method: Stylet (3 blankets under shoulders & foam head cradle for snifffing  position: grade 1 view.) Placement Confirmation: ETT inserted through vocal cords under direct vision,  positive ETCO2 and breath sounds checked- equal and bilateral Secured at: 20 cm Tube secured with: Tape Dental Injury: Teeth and Oropharynx as per pre-operative assessment  Difficulty Due To: Difficulty was unanticipated

## 2013-08-22 NOTE — Brief Op Note (Signed)
ef1/28/2015  5:56 PM  PATIENT:  Angela [place]  48 y.o. female  PRE-OPERATIVE DIAGNOSIS:  Dysfunctional Uterine Bleeding, History of endometrial ablation    POST-OPERATIVE DIAGNOSIS:  dysfunctional uterine bleeding, history of endometrial ablation  PROCEDURE:  Da vinci robotic single site total hysterectomy  SURGEON:  Surgeon(s) and Role:    * Cleburn Maiolo Clint Bolder, MD - Primary    * Lovenia Kim, MD - Assisting  PHYSICIAN ASSISTANT:   ASSISTANTS: Brien Few, MD   ANESTHESIA:   general FINDINGS: bladder adhesions, surgical absent right ovary and bilateral tubes, nl ureters,  Nl left ovary w/ ~ 1cm paraovarian cyst, nl uterus , nl liver edge EBL:  Total I/O In: 2900 [I.V.:2900] Out: 800 [Urine:650; Blood:150]  BLOOD ADMINISTERED:none  DRAINS: none   LOCAL MEDICATIONS USED:  MARCAINE    and BUPIVICAINE   SPECIMEN:  Source of Specimen:  uterus with cervix  DISPOSITION OF SPECIMEN:  PATHOLOGY  COUNTS:  YES  TOURNIQUET:  * No tourniquets in log *  DICTATION: .Dragon Dictation and Other Dictation: Dictation Number [id number]  PLAN OF CARE: Admit for overnight observation  PATIENT DISPOSITION:  PACU - hemodynamically stable.   Delay start of Pharmacological VTE agent (>24hrs) due to surgical blood loss or risk of bleeding: no

## 2013-08-23 LAB — BASIC METABOLIC PANEL
BUN: 9 mg/dL (ref 6–23)
CHLORIDE: 101 meq/L (ref 96–112)
CO2: 29 mEq/L (ref 19–32)
CREATININE: 0.76 mg/dL (ref 0.50–1.10)
Calcium: 8.4 mg/dL (ref 8.4–10.5)
GFR calc Af Amer: >90 mL/min (ref >90)
Glucose, Bld: 157 mg/dL — ABNORMAL HIGH (ref 70–99)
Potassium: 4.2 mEq/L (ref 3.7–5.3)
Sodium: 138 mEq/L (ref 137–147)

## 2013-08-23 LAB — CBC
HEMATOCRIT: 36.9 % (ref 36.0–46.0)
Hemoglobin: 12.6 g/dL (ref 12.0–15.0)
MCH: 29.6 pg (ref 26.0–34.0)
MCHC: 34.1 g/dL (ref 30.0–36.0)
MCV: 86.6 fL (ref 78.0–100.0)
Platelets: 234 10*3/uL (ref 150–400)
RBC: 4.26 MIL/uL (ref 3.87–5.11)
RDW: 12.7 % (ref 11.5–15.5)
WBC: 18.3 10*3/uL — ABNORMAL HIGH (ref 4.0–10.5)

## 2013-08-23 LAB — PROTIME-INR
INR: 1.09 (ref 0.00–1.49)
Prothrombin Time: 13.9 seconds (ref 11.6–15.2)

## 2013-08-23 MED ORDER — OXYCODONE-ACETAMINOPHEN 5-325 MG PO TABS: 1.0000 | ORAL_TABLET | ORAL | Status: DC | PRN

## 2013-08-23 NOTE — Progress Notes (Signed)
Subjective: Patient reports tolerating PO, + flatus and no problems voiding.    Objective: I have reviewed patient's vital signs.  vital signs, intake and output and labs. Filed Vitals:   08/23/13 0532  BP: 106/66  Pulse: 74  Temp: 97.4 F (36.3 C)  Resp: 18   I/O last 3 completed shifts: In: 2900 [I.V.:2900] Out: 2075 [Urine:1925; Blood:150]    Lab Results  Component Value Date   WBC 18.3* 08/23/2013   HGB 12.6 08/23/2013   HCT 36.9 08/23/2013   MCV 86.6 08/23/2013   PLT 234 08/23/2013   Lab Results  Component Value Date   CREATININE 0.76 08/23/2013    EXAM General: alert, cooperative and no distress Resp: clear to auscultation bilaterally Cardio: regular rate and rhythm, S1, S2 normal, no murmur, click, rub or gallop GI: soft, non-tender; bowel sounds normal; no masses,  no organomegaly and dressing @ umbilicus clean/dry/intact Extremities: no edema, redness or tenderness in the calves or thighs Vaginal Bleeding: none  Assessment: s/p Procedure(s): ROBOTIC ASSISTED TOTAL HYSTERECTOMY Hx PE   stable, progressing well and tolerating diet  Plan: Advance diet Encourage ambulation Advance to PO medication Discharge home  Restart lovenox bid and concomitant coumadin 5 mg po qd  LOS: 1 day    Coyt Govoni A, MD 08/23/2013 7:27 AM    08/23/2013, 7:27 AM

## 2013-08-23 NOTE — Discharge Instructions (Signed)
Call if temperature greater than equal to 100.4, nothing per vagina for 4-6 weeks or severe nausea vomiting, increased incisional pain , drainage or redness in the incision site, no straining with bowel movements, showers no bath °

## 2013-08-23 NOTE — Progress Notes (Signed)
Pt discharged to home with husband.  Condition stable.  Pt ambulated to the car with L. Graylon Good, NT.  No equipment for home ordered at discharge.

## 2013-08-23 NOTE — Discharge Summary (Signed)
Physician Discharge Summary  Patient ID: Angela [place] MRN: [id number] DOB/AGE: July 27, 1965 48 y.o.  Admit date: 08/22/2013 Discharge date: 08/23/2013  Admission Diagnoses: DUB, hx DVT/micro PE, Prothrombin deficiency, hx endometrial ablation, previous C/S x 2  Discharge Diagnoses: DUB, hx DVT/MicroPE, prothrombin deficiency, hx endometrial ablation, Previous C/s x 2 Active Problems:   S/P hysterectomy   Discharged Condition: stable  Hospital Course: Pt was admitted to [facility]. She underwent daVinci single site robotic total hysterectomy. Pt had uncomplicated postop course. Path results pending. Will resume lovenox/coumadin on discharge  Consults: None  Significant Diagnostic Studies: labs:  CBC    Component Value Date/Time   WBC 18.3* 08/23/2013 0515   RBC 4.26 08/23/2013 0515   HGB 12.6 08/23/2013 0515   HCT 36.9 08/23/2013 0515   PLT 234 08/23/2013 0515   MCV 86.6 08/23/2013 0515   MCH 29.6 08/23/2013 0515   MCHC 34.1 08/23/2013 0515   RDW 12.7 08/23/2013 0515   LYMPHSABS 2.7 11/16/2007 1336   MONOABS 0.9 11/16/2007 1336   EOSABS 0.1 11/16/2007 1336   BASOSABS 0.0 11/16/2007 1336   BMET    Component Value Date/Time   NA 138 08/23/2013 0515   K 4.2 08/23/2013 0515   CL 101 08/23/2013 0515   CO2 29 08/23/2013 0515   GLUCOSE 157* 08/23/2013 0515   BUN 9 08/23/2013 0515   CREATININE 0.76 08/23/2013 0515   CALCIUM 8.4 08/23/2013 0515   GFRNONAA >90 08/23/2013 0515   GFRAA >90 08/23/2013 0515  INR 1.09    Treatments: surgery: da Vinci single site robotic total hysterectomy  Discharge Exam: Blood pressure 106/66, pulse 74, temperature 97.4 F (36.3 C), temperature source Oral, resp. rate 18, height 5\' 4"  (1.626 m), weight 82.101 kg (181 lb), SpO2 95.00%. General appearance: alert, cooperative and no distress Back: no tenderness to percussion or palpation Resp: clear to auscultation bilaterally Cardio: regular rate and rhythm, S1, S2 normal, no murmur, click, rub or gallop GI: soft,  non-tender; bowel sounds normal; no masses,  no organomegaly and incision: d/c/i Pelvic: deferred Extremities: no edema, redness or tenderness in the calves or thighs  Disposition: 01-Home or Self Care  Discharge Orders   Future Orders Complete By Expires   Diet Carb Modified  As directed    Discharge instructions  As directed    Comments:     Call if temperature greater than equal to 100.4, nothing per vagina for 4-6 weeks or severe nausea vomiting, increased incisional pain , drainage or redness in the incision site, no straining with bowel movements, showers no bath  Resume lovenox and coumadin as instructed already. Check INR in 2 days   Discharge patient  As directed    Increase activity slowly  As directed    No wound care  As directed        Medication List         cholecalciferol 1000 UNITS tablet  Commonly known as:  VITAMIN D  Take 2,000 Units by mouth daily. Pt takes 2,000 units daily.     enoxaparin 80 MG/0.8ML injection  Commonly known as:  LOVENOX  Inject 0.8 mLs (80 mg total) into the skin every 12 (twelve) hours.     enoxaparin 80 MG/0.8ML injection  Commonly known as:  LOVENOX  Inject 80 mg into the skin 2 (two) times daily.     folic acid 1 MG tablet  Commonly known as:  FOLVITE  Take 1 mg by mouth daily.     metoprolol succinate 50  MG 24 hr tablet  Commonly known as:  TOPROL-XL  Take 50 mg by mouth at bedtime. Take with or immediately following a meal.     oxyCODONE-acetaminophen 5-325 MG per tablet  Commonly known as:  PERCOCET/ROXICET  Take 1-2 tablets by mouth every 4 (four) hours as needed for severe pain (moderate to severe pain (when tolerating fluids)).     rizatriptan 10 MG tablet  Commonly known as:  MAXALT  Take 10 mg by mouth as needed. May repeat in 2 hours if needed for migraines.     spironolactone 25 MG tablet  Commonly known as:  ALDACTONE  Take 25 mg by mouth daily.     warfarin 5 MG tablet  Commonly known as:  COUMADIN   Take 5 mg by mouth every other day. Takes 5mg  T-TH-Sat           Follow-up Information   Follow up with Marvene Staff, MD.   Specialty:  Obstetrics and Gynecology   Contact information:   [address] [place] [address] [phone]       Signed: Earlie Arciga A 08/23/2013, 8:08 AM

## 2013-08-24 ENCOUNTER — Encounter (HOSPITAL_COMMUNITY): Payer: Self-pay | Admitting: Obstetrics and Gynecology

## 2013-08-24 NOTE — Op Note (Signed)
NAMELEVINA, BOYACK NO.:  [id number]  MEDICAL RECORD NO.:  [id number]  LOCATION:  4680                          FACILITY:  [facility]  PHYSICIAN:  Servando Salina, M.D.DATE OF BIRTH:  Feb 16, 1966  DATE OF PROCEDURE:  08/22/2013 DATE OF DISCHARGE:  08/23/2013                              OPERATIVE REPORT   PREOPERATIVE DIAGNOSES:  Dysfunctional uterine bleeding, history of endometrial ablation, previous cesarean section x 2.  PROCEDURE:  Da Vinci robotic single site total hysterectomy.  POSTOPERATIVE DIAGNOSES:  Dysfunction uterine bleeding, history of endometrial ablation, previous cesarean section x2.  ANESTHESIA:  General.  SURGEON:  Servando Salina, M.D.  ASSISTANT:  Lovenia Kim, M.D.  [place]:  Under adequate general anesthesia, the patient was placed in the dorsal lithotomy position.  She was positioned for robotic surgery.  Examination under anesthesia revealed slightly enlarged uterus, no adnexal masses could be appreciated.  The patient was sterilely prepped and draped in usual fashion.  A 3-way Foley catheter was sterilely placed.  Weighted speculum was placed in vagina, 0 Vicryl figure-of-eight suture was placed at the anterior and on the posterior lip of the cervix.  The cervix was dilated easily and sounded to 9 cm.  A medium size RUMI cup with a #10 uterine manipulator was introduced into the uterine cavity without incident.  The retractors were removed.  Attention was then turned to the abdomen.  Due to the planned single side incision, the patient has had previous laparoscopy, had evidence of a small incision on the infraumbilical border of her umbilicus making it about a 2-cm incision, a transverse incision was made ultimately involving the umbilical stalk, which was subsequently lifted off and the fascia developed.  The fascia was then opened vertically and slipped underneath with finger digitally, feel  no adhesions.  Stay sutures were placed, 0 Vicryl on the proximal-distal part of the fascia.  The single site apparatus was then inserted, placed with the direction to the target organ, abdomen insufflated and the camera port was then placed, followed by the robotic camera, with the pelvis then carefully inspected.  No evidence of adhesions noted.  Normal liver edge seen. The uterus was of normal size, mobile and decision was then made to proceed with single site surgery.  At that point, the robot was center- docked to the camera port site and the additional #2 single site instruments were then placed under direct visualization, each of which was docked when they were finished placed.  Once this was done, the assistant port of 5 mm was inserted.  The robotic camera was at 30- degree angle down scope, the permanent hook was placed in #2 and the fenestrated grasper was placed in arm #1. I went to the surgical console.  The patient was in Trendelenburg position.  The pelvis was further inspected.  Ureters could be seen bilaterally, there was surgical absence of the right ovary, bilateral surgical absence of the tubes and the left ovary that was normal with a distal small paraovarian cyst. Anteriorly, mild evidence of bladder adhesions from a previous cesarean section.  Procedure was started on the right side where the round ligament was grasped, cauterized,  and then cut, opening the anterior leaf and the posterior leaf of the broad ligament and carrying the anterior leaf dissection to the vaginal cuff area and the KOH ring.  The adhesions were met from the bladder, cesarean section surgery and careful dissection with this was continued.  The uterine vessels was subsequently identified and isolated once the bladder was felt to be displaced inferiorly well enough, those vessels were cauterized and then cut.  The attention was placed to the opposite side.  The left retroperitoneal space was  then opened.  Ureter was identified.  The window in the peritoneum underlying the utero-ovarian ligament was done and the left utero-ovarian ligament was serially clamped, cauterized, and then cut.  Opening up the rest of that brought ligament posteriorly, the round ligament on the left was then clamped, cauterized, and cut. The bladder peritoneum was then further completed and more adhesion was noted on the left side with careful dissection, the bladder was lifted off the lower uterine segment and the KOH ring.  Uterine vessels were skeletonized, they were tortuous and plump.  Nonetheless, they were serially clamped, cauterized, and then cut.  There was some issue with fogginess due to the cauterization resulted with visibility being intermittently challenging, the smoke evacuator was then placed and was working.  The upper portion of the RUMI cup was identified and an incision was then made and gently carried around, bleeding sometimes was noted on the cuff which was cauterized and traction was placed vaginally, the sutures trying to keep the cup in a vertical fashion. Once the cervix was detached from the vagina, the uterus was then removed.  During the case with the dissection, there was mucus from probably nabothian cyst that was seen.  The uterus was removed. Posteriorly, there was some bleeding noted, there was some slight tangential cut to the posterior aspect of the vaginal cuff.  Switching of instruments subsequently resulted in the hemostasis.  At that point, once that was achieved, the instruments were replaced with the Wristed Needle Driver and a large needle holder.  A 0 V-Loc suture was placed, which was with a blunt tip.  The bladder was felt to be displaced inferiorly enough for its closure.  Due to the tangential portion of the posterior vaginal cuff, it was challenging to close the vagina but careful persistent and identification of the posterior vaginal peritoneal side  resulted in closure of the vaginal cuff.  In halfway through, because of the blunt needle, the instruments were switched out to the 300 mm robotic single site ports and the 0-degree angle scope was utilized for better visualization as well.  The cuff was subsequently closed, digitally inspected.  The ureters were still seen peristalsing at the end of the case.  The left ovary had a small paraovarian cyst which was about a centimeter and was opened but not removed, drained clear fluid.  Once hemostasis was achieved, it was felt to be fine.  The robotic instruments were removed and the robot was undocked by removing the single-side instruments starting from the right side of the patient.  I then left the surgical console and went back sterilely to the patient bedside.  The needle was removed and the gel port was also removed.  The bupivacaine was intrabdominally and the pelvis for pain management postoperatively and the stay sutures were lifted up and brought the fascia view.  Fascia was closed with 0 PDS running stitch.  The stay suture was tied and then approximated in the  midline.  Some bupivacaine was also injected on the fascia circumferentially.  The umbilical stalk was restored using 3-0 Monocryl suture, which was then attached to the fascia and 4-0 Vicryl subcuticular suture was then utilized.  Small packing was placed in the umbilicus and OpSite was placed over that.  I reinspected the pelvis, good approximation was once again noted.  SPECIMEN:  Uterus with cervix sent to Pathology.  ESTIMATED BLOOD LOSS:  100 mL.  INTRAOPERATIVE FLUID:  1900.  URINE OUTPUT:  650 mL clear yellow urine.  COUNTS:  Sponge and instrument counts x2 was correct.  COMPLICATION:  None.  The patient tolerated the procedure well, was transferred to recovery in stable condition.     Servando Salina, M.D.     [place]/MEDQ  D:  08/23/2013  T:  08/24/2013  Job:  [id number]

## 2013-09-15 ENCOUNTER — Inpatient Hospital Stay (HOSPITAL_COMMUNITY)
Admission: AD | Admit: 2013-09-15 | Discharge: 2013-09-15 | Disposition: A | Discharge status: Home / Self Care | Payer: BC Managed Care – PPO | Source: Ambulatory Visit | Attending: Obstetrics and Gynecology | Admitting: Obstetrics and Gynecology

## 2013-09-15 ENCOUNTER — Inpatient Hospital Stay (HOSPITAL_COMMUNITY): Payer: BC Managed Care – PPO

## 2013-09-15 ENCOUNTER — Encounter (HOSPITAL_COMMUNITY): Payer: Self-pay

## 2013-09-15 ENCOUNTER — Encounter (HOSPITAL_COMMUNITY): Payer: Self-pay | Admitting: Family

## 2013-09-15 DIAGNOSIS — Z90710 Acquired absence of both cervix and uterus: Secondary | ICD-10-CM

## 2013-09-15 DIAGNOSIS — N898 Other specified noninflammatory disorders of vagina: Secondary | ICD-10-CM | POA: Insufficient documentation

## 2013-09-15 DIAGNOSIS — IMO0002 Reserved for concepts with insufficient information to code with codable children: Secondary | ICD-10-CM

## 2013-09-15 MED ORDER — IOHEXOL 300 MG/ML  SOLN
50.0000 mL | INTRAMUSCULAR | Status: AC
  Administered 2013-09-15: 50 mL via ORAL

## 2013-09-15 MED ORDER — IOHEXOL 300 MG/ML  SOLN
100.0000 mL | Freq: Once | INTRAMUSCULAR | Status: AC | PRN
  Administered 2013-09-15: 100 mL via INTRAVENOUS

## 2013-09-15 NOTE — MAU Note (Signed)
Presents s/p 1/26 hysterectomy with c/o frank red bleeding noted this morning. Denies fever, chills, n/v.

## 2013-09-15 NOTE — MAU Provider Note (Addendum)
History   48 yo P2 MWF s/p da vinci robotic SS hysterectomy 1/28 presents with c/o onset of painless vaginal bleeding this am. Pt denies abdominal pain or pelvic pressure. Pt is on coumadin. INR today was 2.3( checked at home by pt)  No chief complaint on file. CC: vaginal bleeding   OB History   Grav Para Term Preterm Abortions TAB SAB Ect Mult Living                  Past Medical History  Diagnosis Date  . Hyperlipidemia 2008    diet controlled  . Headache(784.0)   . Hypertension   . PONV (postoperative nausea and vomiting)   . 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 Tonsillectomy    . Cesarean section  1994, 1998    2  . Lapor      Ovarian Cyst and appedectomy  . Appendectomy    . Cholecystectomy    . Tubal ligation    . Breast reduction surgery  2000  . Laparoscopy  02/24/2012    Procedure: LAPAROSCOPY OPERATIVE;  Surgeon: Marvene Staff, MD;  Location: [facility];  Service: Gynecology;  Laterality: Right;  with Lyses of Adhesions  . Salpingoophorectomy  02/24/2012    Procedure: SALPINGO OOPHERECTOMY;  Surgeon: Marvene Staff, MD;  Location: [facility];  Service: Gynecology;  Laterality: Right;  Right Salpingo-Oophorectomy; Left Salpingectomy  . Colon surgery      reduction  . Robotic assisted total hysterectomy N/A 08/22/2013    Procedure: ROBOTIC ASSISTED TOTAL HYSTERECTOMY/BILATERAL SALPINGECTOMY;  Surgeon: Marvene Staff, MD;  Location: [facility];  Service: Gynecology;  Laterality: N/A;    Family History  Problem Relation Age of Onset  . Parkinsonism Mother   . Heart attack Mother   . Heart attack Father   . Thyroid cancer Sister     History  Substance Use Topics  . Smoking status: Never Smoker   . Smokeless tobacco: Never Used  . Alcohol Use: No    Allergies:  Allergies  Allergen Reactions  . Morphine And Related Hives    Prescriptions prior to admission   Medication Sig Dispense Refill  . cholecalciferol (VITAMIN D) 1000 UNITS tablet Take 2,000 Units by mouth daily. Pt takes 2,000 units daily.      . metoprolol succinate (TOPROL-XL) 50 MG 24 hr tablet Take 50 mg by mouth at bedtime. Take with or immediately following a meal.      . rizatriptan (MAXALT) 10 MG tablet Take 10 mg by mouth as needed for migraine. May repeat in 2 hours if needed for migraines.      Marland Kitchen spironolactone (ALDACTONE) 25 MG tablet Take 25 mg by mouth daily.        Marland Kitchen warfarin (COUMADIN) 5 MG tablet Take 5 mg by mouth every other day.          Physical Exam   Blood pressure 131/74, pulse 67, temperature 97.4 F (36.3 C), temperature source Oral, resp. rate 16, last menstrual period 08/10/2013.  General appearance: alert, cooperative and no distress Abdomen: soft nontender incision well approximated Pelvic: external genitalia normal and vaginal cuff well approximated, scant blood in vault. bimanual nontender, welll approximated on diigital exam withoudt palp fullness or bulge. 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 R/o cuff collection.  CT scan with contrast per radiology MDM   Aralyn Nowak A, MD 1:29 PM 09/15/2013     Addendum CT scan negative Suspect bleeding related to anticoagulation. Usually INR aimed for b/w 2-3 however given this situation will have pt contact Dr Nevada Crane regarding decreasing levels to 1. To cont pelvic rest. Keep scheduled office appt 2/24

## 2013-10-18 ENCOUNTER — Encounter (HOSPITAL_COMMUNITY): Payer: Self-pay | Admitting: Emergency Medicine

## 2013-10-18 ENCOUNTER — Emergency Department (HOSPITAL_COMMUNITY)
Admission: EM | Admit: 2013-10-18 | Discharge: 2013-10-18 | Disposition: A | Discharge status: Home / Self Care | Payer: BC Managed Care – PPO | Source: Home / Self Care

## 2013-10-18 DIAGNOSIS — M2669 Other specified disorders of temporomandibular joint: Secondary | ICD-10-CM

## 2013-10-18 DIAGNOSIS — M26629 Arthralgia of temporomandibular joint, unspecified side: Secondary | ICD-10-CM

## 2013-10-18 HISTORY — DX: Acute embolism and thrombosis of unspecified deep veins of unspecified lower extremity: I82.409

## 2013-10-18 MED ORDER — HYDROCODONE-ACETAMINOPHEN 5-325 MG PO TABS: 1.0000 | ORAL_TABLET | Freq: Four times a day (QID) | ORAL | Status: DC | PRN

## 2013-10-18 MED ORDER — PREDNISONE 10 MG PO TABS: ORAL_TABLET | ORAL | Status: DC

## 2013-10-18 NOTE — ED Notes (Signed)
Right jaw pain , ?jaw being out of place.  Incident today with yawning.  Patient has pain on the right and notes that her back teeth are not touching.

## 2013-10-18 NOTE — Discharge Instructions (Signed)
Temporomandibular Problems  Temporomandibular joint (TMJ) dysfunction means there are problems with the joint between your jaw and your skull. This is a joint lined by cartilage like other joints in your body but also has a small disc in the joint which keeps the bones from rubbing on each other. These joints are like other joints and can get inflamed (sore) from arthritis and other problems. When this joint gets sore, it can cause headaches and pain in the jaw and the face. CAUSES  Usually the arthritic types of problems are caused by soreness in the joint. Soreness in the joint can also be caused by overuse. This may come from grinding your teeth. It may also come from mis-alignment in the joint. DIAGNOSIS Diagnosis of this condition can often be made by history and exam. Sometimes your caregiver may need X-rays or an MRI scan to determine the exact cause. It may be necessary to see your dentist to determine if your teeth and jaws are lined up correctly. TREATMENT  Most of the time this problem is not serious; however, sometimes it can persist (become chronic). When this happens medications that will cut down on inflammation (soreness) help. Sometimes a shot of cortisone into the joint will be helpful. If your teeth are not aligned it may help for your dentist to make a splint for your mouth that can help this problem. If no physical problems can be found, the problem may come from tension. If tension is found to be the cause, biofeedback or relaxation techniques may be helpful. HOME CARE INSTRUCTIONS   Later in the day, applications of ice packs may be helpful. Ice can be used in a plastic bag with a towel around it to prevent frostbite to skin. This may be used about every 2 hours for 20 to 30 minutes, as needed while awake, or as directed by your caregiver.  Only take over-the-counter or prescription medicines for pain, discomfort, or fever as directed by your caregiver.  If physical therapy was  prescribed, follow your caregiver's directions.  Wear mouth appliances as directed if they were given. Document Released: 04/06/2001 Document Revised: 10/04/2011 Document Reviewed: 07/14/2008 [employer] Patient Information 2014 [employer].  

## 2013-10-18 NOTE — ED Provider Notes (Signed)
CSN: [id number]     Arrival date & time 10/18/13  2002 History   None    Chief Complaint  Patient presents with  . Jaw Pain   (Consider location/radiation/quality/duration/timing/severity/associated sxs/prior Treatment) HPI Comments: 48 yo WF presents with right side jaw pain after yawning. She notes it does not feel like her teeth are lining up as they used too. She denies any prior HX of TMJ dysfunction/ pain. She does have HX of braces x 3 years with mild over bite. She has not tried any OTC. She is on coumadin currently and manages at home with monitor and check routinely and changes dose accordingly. She is a Chief Executive Officer for [employer]   Past Medical History  Diagnosis Date  . Hyperlipidemia 2008    diet controlled  . Headache(784.0)   . Hypertension   . PONV (postoperative nausea and vomiting)   . 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 DVT (deep venous thrombosis)    Past Surgical History  Procedure Laterality Date  . Tonsillectomy    . Cesarean section  1994, 1998    2  . Lapor      Ovarian Cyst and appedectomy  . Appendectomy    . Cholecystectomy    . Tubal ligation    . Breast reduction surgery  2000  . Laparoscopy  02/24/2012    Procedure: LAPAROSCOPY OPERATIVE;  Surgeon: Marvene Staff, MD;  Location: [facility];  Service: Gynecology;  Laterality: Right;  with Lyses of Adhesions  . Salpingoophorectomy  02/24/2012    Procedure: SALPINGO OOPHERECTOMY;  Surgeon: Marvene Staff, MD;  Location: [facility];  Service: Gynecology;  Laterality: Right;  Right Salpingo-Oophorectomy; Left Salpingectomy  . Colon surgery      reduction  . Robotic assisted total hysterectomy N/A 08/22/2013    Procedure: ROBOTIC ASSISTED TOTAL HYSTERECTOMY/BILATERAL SALPINGECTOMY;  Surgeon: Marvene Staff, MD;  Location: [facility];  Service: Gynecology;  Laterality: N/A;   Family History  Problem Relation Age of Onset  . Parkinsonism Mother   . Heart attack  Mother   . Heart attack Father   . Thyroid cancer Sister    History  Substance Use Topics  . Smoking status: Never Smoker   . Smokeless tobacco: Never Used  . Alcohol Use: No   OB History   Grav Para Term Preterm Abortions TAB SAB Ect Mult Living                 Review of Systems  HENT:       Jaw pain  All other systems reviewed and are negative.    Allergies  Morphine and related  Home Medications   Current Outpatient Rx  Name  Route  Sig  Dispense  Refill  . cholecalciferol (VITAMIN D) 1000 UNITS tablet   Oral   Take 2,000 Units by mouth daily. Pt takes 2,000 units daily.         Marland Kitchen HYDROcodone-acetaminophen (NORCO) 5-325 MG per tablet   Oral   Take 1 tablet by mouth every 6 (six) hours as needed for moderate pain.   30 tablet   0   . metoprolol succinate (TOPROL-XL) 50 MG 24 hr tablet   Oral   Take 50 mg by mouth at bedtime. Take with or immediately following a meal.         . predniSONE (DELTASONE) 10 MG tablet      1 po TID x 3 days, 1 PO  BID x 3 days, 1 po QD x 5 days   20 tablet   0   . rizatriptan (MAXALT) 10 MG tablet   Oral   Take 10 mg by mouth as needed for migraine. May repeat in 2 hours if needed for migraines.         Marland Kitchen spironolactone (ALDACTONE) 25 MG tablet   Oral   Take 25 mg by mouth daily.           Marland Kitchen warfarin (COUMADIN) 5 MG tablet   Oral   Take 5 mg by mouth every other day.           BP 133/83  Pulse 59  Temp(Src) 98.2 F (36.8 C) (Oral)  Resp 12  SpO2 97%  LMP 08/10/2013 Physical Exam  Nursing note and vitals reviewed. Constitutional: She appears well-developed and well-nourished.  HENT:  Head: Normocephalic and atraumatic.  Right Ear: External ear normal.  Left Ear: External ear normal.  Nose: Nose normal.  Mouth/Throat: Oropharynx is clear and moist. No oropharyngeal exudate.  + pain with limited ROM of Jaw at right TMJ, NO obvious clicking  Neck: Normal range of motion. Neck supple.  Cardiovascular:  Normal rate, regular rhythm and normal heart sounds.   Pulmonary/Chest: Effort normal and breath sounds normal.  Lymphadenopathy:    She has no cervical adenopathy.    ED Course  Procedures (including critical care time) Labs Review Labs Reviewed - No data to display Imaging Review No results found.   MDM   1. TMJ pain dysfunction syndrome   Prednisone 10 MG DP AD, Monitor Coumadin closely f/u PCP with any concerns. Hygiene advised for TMJ, advised F/u Dentist ASAP    Ardis Hughs, PA-C 10/18/13 2149

## 2013-10-19 NOTE — ED Provider Notes (Signed)
Medical screening examination/treatment/procedure(s) were performed by a resident physician or non-physician practitioner and as the supervising physician I was immediately available for consultation/collaboration.  Lynne Leader, MD   Gregor Hams, MD 10/19/13 [phone]

## 2014-09-26 ENCOUNTER — Emergency Department (HOSPITAL_COMMUNITY): Payer: No Typology Code available for payment source

## 2014-09-26 ENCOUNTER — Emergency Department (HOSPITAL_COMMUNITY)
Admission: EM | Admit: 2014-09-26 | Discharge: 2014-09-26 | Disposition: A | Discharge status: Home / Self Care | Payer: No Typology Code available for payment source | Attending: Emergency Medicine | Admitting: Emergency Medicine

## 2014-09-26 ENCOUNTER — Encounter (HOSPITAL_COMMUNITY): Payer: Self-pay | Admitting: Emergency Medicine

## 2014-09-26 DIAGNOSIS — R0789 Other chest pain: Secondary | ICD-10-CM

## 2014-09-26 DIAGNOSIS — Z86718 Personal history of other venous thrombosis and embolism: Secondary | ICD-10-CM | POA: Diagnosis not present

## 2014-09-26 DIAGNOSIS — I10 Essential (primary) hypertension: Secondary | ICD-10-CM | POA: Diagnosis not present

## 2014-09-26 DIAGNOSIS — I739 Peripheral vascular disease, unspecified: Secondary | ICD-10-CM | POA: Insufficient documentation

## 2014-09-26 DIAGNOSIS — Y998 Other external cause status: Secondary | ICD-10-CM | POA: Insufficient documentation

## 2014-09-26 DIAGNOSIS — S6992XA Unspecified injury of left wrist, hand and finger(s), initial encounter: Secondary | ICD-10-CM | POA: Diagnosis not present

## 2014-09-26 DIAGNOSIS — Z8639 Personal history of other endocrine, nutritional and metabolic disease: Secondary | ICD-10-CM | POA: Insufficient documentation

## 2014-09-26 DIAGNOSIS — S161XXA Strain of muscle, fascia and tendon at neck level, initial encounter: Secondary | ICD-10-CM | POA: Diagnosis not present

## 2014-09-26 DIAGNOSIS — R791 Abnormal coagulation profile: Secondary | ICD-10-CM | POA: Insufficient documentation

## 2014-09-26 DIAGNOSIS — Z7901 Long term (current) use of anticoagulants: Secondary | ICD-10-CM | POA: Insufficient documentation

## 2014-09-26 DIAGNOSIS — Y92410 Unspecified street and highway as the place of occurrence of the external cause: Secondary | ICD-10-CM | POA: Diagnosis not present

## 2014-09-26 DIAGNOSIS — Z79899 Other long term (current) drug therapy: Secondary | ICD-10-CM | POA: Diagnosis not present

## 2014-09-26 DIAGNOSIS — Y939 Activity, unspecified: Secondary | ICD-10-CM | POA: Insufficient documentation

## 2014-09-26 DIAGNOSIS — S29001A Unspecified injury of muscle and tendon of front wall of thorax, initial encounter: Secondary | ICD-10-CM | POA: Insufficient documentation

## 2014-09-26 DIAGNOSIS — S199XXA Unspecified injury of neck, initial encounter: Secondary | ICD-10-CM | POA: Diagnosis present

## 2014-09-26 DIAGNOSIS — M25532 Pain in left wrist: Secondary | ICD-10-CM

## 2014-09-26 HISTORY — DX: Tachycardia, unspecified: R00.0

## 2014-09-26 LAB — I-STAT CHEM 8, ED
BUN: 13 mg/dL (ref 6–23)
Calcium, Ion: 1.09 mmol/L — ABNORMAL LOW (ref 1.12–1.23)
Chloride: 99 mmol/L (ref 96–112)
Creatinine, Ser: 0.8 mg/dL (ref 0.50–1.10)
Glucose, Bld: 98 mg/dL (ref 70–99)
HEMATOCRIT: 48 % — AB (ref 36.0–46.0)
Hemoglobin: 16.3 g/dL — ABNORMAL HIGH (ref 12.0–15.0)
POTASSIUM: 3.8 mmol/L (ref 3.5–5.1)
SODIUM: 139 mmol/L (ref 135–145)
TCO2: 25 mmol/L (ref 0–100)

## 2014-09-26 LAB — PROTIME-INR
INR: 1.4 (ref 0.00–1.49)
Prothrombin Time: 17.3 seconds — ABNORMAL HIGH (ref 11.6–15.2)

## 2014-09-26 MED ORDER — IOHEXOL 300 MG/ML  SOLN
100.0000 mL | Freq: Once | INTRAMUSCULAR | Status: AC | PRN
  Administered 2014-09-26: 100 mL via INTRAVENOUS

## 2014-09-26 MED ORDER — OXYCODONE-ACETAMINOPHEN 5-325 MG PO TABS: 1.0000 | ORAL_TABLET | ORAL | Status: DC | PRN

## 2014-09-26 MED ORDER — FENTANYL CITRATE 0.05 MG/ML IJ SOLN
50.0000 ug | Freq: Once | INTRAMUSCULAR | Status: AC
Start: 2014-09-26 — End: 2014-09-26
  Administered 2014-09-26: 50 ug via INTRAVENOUS
  Filled 2014-09-26: qty 2

## 2014-09-26 MED ORDER — CYCLOBENZAPRINE HCL 5 MG PO TABS: 10.0000 mg | ORAL_TABLET | Freq: Three times a day (TID) | ORAL | Status: DC | PRN

## 2014-09-26 MED ORDER — CYCLOBENZAPRINE HCL 5 MG PO TABS: 5.0000 mg | ORAL_TABLET | Freq: Three times a day (TID) | ORAL | Status: DC | PRN
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
## 2014-09-26 NOTE — ED Notes (Signed)
Pt is on Coumadin. 

## 2014-09-26 NOTE — ED Notes (Signed)
Pt transported to CT ?
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
## 2015-04-15 ENCOUNTER — Other Ambulatory Visit: Payer: Self-pay | Admitting: Obstetrics and Gynecology

## 2015-04-15 DIAGNOSIS — N83202 Unspecified ovarian cyst, left side: Secondary | ICD-10-CM

## 2015-04-16 ENCOUNTER — Inpatient Hospital Stay
Admission: RE | Admit: 2015-04-16 | Discharge: 2015-04-16 | Disposition: A | Discharge status: Home / Self Care | Payer: Self-pay | Source: Ambulatory Visit | Attending: Obstetrics and Gynecology | Admitting: Obstetrics and Gynecology

## 2015-05-25 ENCOUNTER — Emergency Department (HOSPITAL_COMMUNITY)
Admission: EM | Admit: 2015-05-25 | Discharge: 2015-05-25 | Disposition: A | Discharge status: Home / Self Care | Payer: 59 | Attending: Emergency Medicine | Admitting: Emergency Medicine

## 2015-05-25 ENCOUNTER — Emergency Department (HOSPITAL_COMMUNITY): Payer: 59

## 2015-05-25 ENCOUNTER — Encounter (HOSPITAL_COMMUNITY): Payer: Self-pay | Admitting: Emergency Medicine

## 2015-05-25 DIAGNOSIS — Z87828 Personal history of other (healed) physical injury and trauma: Secondary | ICD-10-CM | POA: Diagnosis not present

## 2015-05-25 DIAGNOSIS — M436 Torticollis: Secondary | ICD-10-CM

## 2015-05-25 DIAGNOSIS — Z79899 Other long term (current) drug therapy: Secondary | ICD-10-CM | POA: Insufficient documentation

## 2015-05-25 DIAGNOSIS — Z86718 Personal history of other venous thrombosis and embolism: Secondary | ICD-10-CM | POA: Insufficient documentation

## 2015-05-25 DIAGNOSIS — R110 Nausea: Secondary | ICD-10-CM | POA: Diagnosis not present

## 2015-05-25 DIAGNOSIS — M542 Cervicalgia: Secondary | ICD-10-CM | POA: Diagnosis present

## 2015-05-25 DIAGNOSIS — Z8639 Personal history of other endocrine, nutritional and metabolic disease: Secondary | ICD-10-CM | POA: Insufficient documentation

## 2015-05-25 DIAGNOSIS — I10 Essential (primary) hypertension: Secondary | ICD-10-CM | POA: Insufficient documentation

## 2015-05-25 MED ORDER — OXYCODONE-ACETAMINOPHEN 5-325 MG PO TABS
1.0000 | ORAL_TABLET | Freq: Once | ORAL | Status: AC
  Administered 2015-05-25: 1 via ORAL
  Filled 2015-05-25: qty 1

## 2015-05-25 MED ORDER — METHOCARBAMOL 500 MG PO TABS: 500.0000 mg | ORAL_TABLET | Freq: Three times a day (TID) | ORAL | Status: DC

## 2015-05-25 MED ORDER — OXYCODONE-ACETAMINOPHEN 5-325 MG PO TABS: 1.0000 | ORAL_TABLET | ORAL | Status: DC | PRN

## 2015-05-25 MED ORDER — METHOCARBAMOL 500 MG PO TABS
500.0000 mg | ORAL_TABLET | Freq: Once | ORAL | Status: AC
  Administered 2015-05-25: 500 mg via ORAL
  Filled 2015-05-25: qty 1
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
## 2015-05-25 NOTE — Discharge Instructions (Signed)
Acute Torticollis °Torticollis is a condition in which the muscles of the neck tighten (contract) abnormally, causing the neck to twist and the head to move into an unnatural position. Torticollis that develops suddenly is called acute torticollis. If torticollis becomes chronic and is left untreated, the face and neck can become deformed. °CAUSES °This condition may be caused by: °· Sleeping in an awkward position (common). °· Extending or twisting the neck muscles beyond their normal position. °· Infection. °In some cases, the cause may not be known. °SYMPTOMS °Symptoms of this condition include: °· An unnatural position of the head. °· Neck pain. °· A limited ability to move the neck. °· Twisting of the neck to one side. °DIAGNOSIS °This condition is diagnosed with a physical exam. You may also have imaging tests, such as an X-ray, CT scan, or MRI. °TREATMENT °Treatment for this condition involves trying to relax the neck muscles. It may include: °· Medicines or shots. °· Physical therapy. °· Surgery. This may be done in severe cases. °HOME CARE INSTRUCTIONS °· Take medicines only as directed by your health care provider. °· Do stretching exercises and massage your neck as directed by your health care provider. °· Keep all follow-up visits as directed by your health care provider. This is important. °SEEK MEDICAL CARE IF: °· You develop a fever. °SEEK IMMEDIATE MEDICAL CARE IF: °· You develop difficulty breathing. °· You develop noisy breathing (stridor). °· You start drooling. °· You have trouble swallowing or have pain with swallowing. °· You develop numbness or weakness in your hands or feet. °· You have changes in your speech, understanding, or vision. °· Your pain gets worse. °  °This information is not intended to replace advice given to you by your health care provider. Make sure you discuss any questions you have with your health care provider. °  °Document Released: 07/09/2000 Document Revised:  11/26/2014 Document Reviewed: 07/08/2014 °Elsevier Interactive Patient Education ©2016 Else[employer] Inc[employer] ° °

## 2015-05-25 NOTE — ED Notes (Signed)
Pt states she started having neck pain yesterday that radiated to both side, pt states today the neck pain is on the right side. Pt states she is unable to move her neck due to the pain. Pt denies any injury to her neck or previous neck injury.

## 2015-05-29 ENCOUNTER — Ambulatory Visit (HOSPITAL_COMMUNITY)
Admission: RE | Admit: 2015-05-29 | Discharge: 2015-05-29 | Disposition: A | Discharge status: Home / Self Care | Payer: 59 | Source: Ambulatory Visit | Attending: Internal Medicine | Admitting: Internal Medicine

## 2015-05-29 ENCOUNTER — Other Ambulatory Visit (HOSPITAL_COMMUNITY): Payer: Self-pay | Admitting: Internal Medicine

## 2015-05-29 DIAGNOSIS — M50221 Other cervical disc displacement at C4-C5 level: Secondary | ICD-10-CM | POA: Insufficient documentation

## 2015-05-29 DIAGNOSIS — R51 Headache: Secondary | ICD-10-CM | POA: Diagnosis present

## 2015-05-29 DIAGNOSIS — M5481 Occipital neuralgia: Secondary | ICD-10-CM

## 2015-05-29 DIAGNOSIS — M4802 Spinal stenosis, cervical region: Secondary | ICD-10-CM | POA: Diagnosis not present

## 2016-05-06 ENCOUNTER — Ambulatory Visit (INDEPENDENT_AMBULATORY_CARE_PROVIDER_SITE_OTHER): Payer: 59 | Admitting: Rheumatology

## 2016-05-06 DIAGNOSIS — M170 Bilateral primary osteoarthritis of knee: Secondary | ICD-10-CM | POA: Diagnosis not present

## 2016-05-06 DIAGNOSIS — R6889 Other general symptoms and signs: Secondary | ICD-10-CM | POA: Diagnosis not present

## 2016-05-06 DIAGNOSIS — M7071 Other bursitis of hip, right hip: Secondary | ICD-10-CM

## 2016-07-29 ENCOUNTER — Ambulatory Visit (INDEPENDENT_AMBULATORY_CARE_PROVIDER_SITE_OTHER): Payer: 59 | Admitting: Rheumatology

## 2016-07-29 ENCOUNTER — Encounter: Payer: Self-pay | Admitting: Rheumatology

## 2016-07-29 VITALS — BP 130/72 | HR 92 | Resp 14 | Ht 64.0 in | Wt 183.0 lb

## 2016-07-29 DIAGNOSIS — M79672 Pain in left foot: Secondary | ICD-10-CM

## 2016-07-29 DIAGNOSIS — M7702 Medial epicondylitis, left elbow: Secondary | ICD-10-CM | POA: Diagnosis not present

## 2016-07-29 DIAGNOSIS — M359 Systemic involvement of connective tissue, unspecified: Secondary | ICD-10-CM | POA: Diagnosis not present

## 2016-07-29 DIAGNOSIS — M170 Bilateral primary osteoarthritis of knee: Secondary | ICD-10-CM | POA: Diagnosis not present

## 2016-07-29 DIAGNOSIS — M25572 Pain in left ankle and joints of left foot: Secondary | ICD-10-CM

## 2016-07-29 DIAGNOSIS — M79671 Pain in right foot: Secondary | ICD-10-CM

## 2016-07-29 DIAGNOSIS — L408 Other psoriasis: Secondary | ICD-10-CM | POA: Diagnosis not present

## 2016-07-29 DIAGNOSIS — M25571 Pain in right ankle and joints of right foot: Secondary | ICD-10-CM

## 2016-07-29 MED ORDER — DICLOFENAC SODIUM 1 % TD GEL: TRANSDERMAL | 3 refills | Status: DC

## 2016-07-29 MED ORDER — DULOXETINE HCL 30 MG PO CPEP: 30.0000 mg | ORAL_CAPSULE | Freq: Every day | ORAL | 5 refills | Status: DC

## 2016-07-29 NOTE — Progress Notes (Signed)
Office Visit Note  Patient: Angela Washington             Date of Birth: 05/04/1966           MRN: [id number]             PCP: Wende Neighbors, MD Referring: Celene Squibb, MD Visit Date: 07/29/2016 Occupation: @GUAROCC @    Subjective:  Pain of the Left Elbow; Pain of the Right Foot; and Pain of the Left Foot   History of Present Illness: Angela Washington is a 51 y.o. female  Last seen by Dr. Estanislado Pandy 05/06/2016. On that visit patient reviewed her August 2017 labs in detail. There was an autoimmune workup. All was negative except positive ANA with a titer of 1:320. On the examination there were no clinical features of autoimmune disease.  Advised to return to clinic when necessary.  Patient returns today because she is had a positive skin biopsy on the right calf at Dr. Marisue Ivan office. The biopsy shows the lesion to be psoriasis.  Patient also reports that she's had more than 15 years of ongoing joint pain. She also reports about 2 hours of morning stiffness that includes waking up at staying in bed for about 20 minutes, then 30 minutes of "walking like an old lady", and then another hour of moderate stiffness improves gradually throughout the hour.   She does not have any oral or nasal ulcers. She does not have any new rash. She does not have any extra fatigue, nail pitting, Achilles tendinitis, synovitis. She complains of ongoing pain to her feet and currently left elbow pain to the medial aspect.  Activities of Daily Living:  Patient reports morning stiffness for 2 hours.   Patient Reports nocturnal pain.  Difficulty dressing/grooming: Reports Difficulty climbing stairs: Denies Difficulty getting out of chair: Denies Difficulty using hands for taps, buttons, cutlery, and/or writing: Reports   Review of Systems  Constitutional: Negative for fatigue.  HENT: Negative for mouth sores and mouth dryness.   Eyes: Negative for dryness.  Respiratory: Negative for shortness of breath.     Gastrointestinal: Negative for constipation and diarrhea.  Musculoskeletal: Negative for myalgias and myalgias.  Skin: Negative for sensitivity to sunlight.  Psychiatric/Behavioral: Negative for decreased concentration and sleep disturbance.    PMFS History:  Patient Active Problem List   Diagnosis Date Noted  . S/P hysterectomy 08/22/2013  . RLQ abdominal pain 02/25/2012  . Ovarian torsion 02/25/2012  . Pain 06/30/2011    Past Medical History:  Diagnosis Date  . DVT (deep venous thrombosis) ([place])   . Headache(784.0)   . Hyperlipidemia 2008   diet controlled  . Hypertension   . Peripheral vascular disease ([place]) 2008   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  . PONV (postoperative nausea and vomiting)   . Tachycardia     Family History  Problem Relation Age of Onset  . Parkinsonism Mother   . Heart attack Mother   . Heart attack Father   . Thyroid cancer Sister    Past Surgical History:  Procedure Laterality Date  . APPENDECTOMY    . BREAST REDUCTION SURGERY  2000  . [place]   2  . CHOLECYSTECTOMY    . COLON SURGERY     reduction  . LAPAROSCOPY  02/24/2012   Procedure: LAPAROSCOPY OPERATIVE;  Surgeon: Marvene Staff, MD;  Location: [facility];  Service: Gynecology;  Laterality: Right;  with Lyses of Adhesions  . lapor  Ovarian Cyst and appedectomy  . ROBOTIC ASSISTED TOTAL HYSTERECTOMY N/A 08/22/2013   Procedure: ROBOTIC ASSISTED TOTAL HYSTERECTOMY/BILATERAL SALPINGECTOMY;  Surgeon: Marvene Staff, MD;  Location: [facility];  Service: Gynecology;  Laterality: N/A;  . SALPINGOOPHORECTOMY  02/24/2012   Procedure: SALPINGO OOPHERECTOMY;  Surgeon: Marvene Staff, MD;  Location: [facility];  Service: Gynecology;  Laterality: Right;  Right Salpingo-Oophorectomy; Left Salpingectomy  . TONSILLECTOMY    . TUBAL LIGATION     Social History   Social History Narrative  . No narrative on file     Objective: Vital Signs: BP 130/72   Pulse 92   Resp 14    Ht 5\' 4"  (1.626 m)   Wt 183 lb (83 kg)   LMP 08/10/2013   BMI 31.41 kg/m    Physical Exam  Constitutional: She is oriented to person, place, and time. She appears well-developed and well-nourished.  HENT:  Head: Normocephalic and atraumatic.  Eyes: EOM are normal. Pupils are equal, round, and reactive to light.  Cardiovascular: Normal rate, regular rhythm and normal heart sounds.  Exam reveals no gallop and no friction rub.   No murmur heard. Pulmonary/Chest: Effort normal and breath sounds normal. She has no wheezes. She has no rales.  Abdominal: Soft. Bowel sounds are normal. She exhibits no distension. There is no tenderness. There is no guarding. No hernia.  Musculoskeletal: Normal range of motion. She exhibits no edema, tenderness or deformity.  Lymphadenopathy:    She has no cervical adenopathy.  Neurological: She is alert and oriented to person, place, and time. Coordination normal.  Skin: Skin is warm and dry. Capillary refill takes less than 2 seconds. No rash noted.  Psychiatric: She has a normal mood and affect. Her behavior is normal.  Nursing note and vitals reviewed.    Musculoskeletal Exam:  Full range of motion of all joints Grip strength is equal and strong bilaterally Fiber myalgia tender points are all absent  CDAI Exam: CDAI Homunculus Exam:   Tenderness:  LUE: ulnohumeral and radiohumeral  Joint Counts:  CDAI Tender Joint count: 1 CDAI Swollen Joint count: 0     Investigation: No additional findings.   Imaging: No results found.  Speciality Comments: No specialty comments available.    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 Tonia Brooms (Rt Calf).  Pain in joints of both feet - January 2018 :==>Dorsal Feet despite wearing very good shoes   PLAN ==> 1) SCHEDULE ULTRASOUND BILATERAL HANDS / WRISTS DUE TO +Psoriasis Bx w/ Dr. Tonia Brooms dec 2017 and medical epicondylitis and various joint pain, 1-2 hr morning stiffness  #2: Prescription for Cymbalta 30 mg daily. Thirty-day supply with 5 refills. We will check and see how the patient responds Cymbalta for her joint pain in about 3 months at follow-up visit.  #3: I've written her prescription for left medial epicondyle brace. She will go to [employer] or medical supply store of her choice.  #4: Voltaren gel prescribed for the patient. We discussed in detail how to use the medication properly. 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 Her most recent autoimmune workup was August 2017 and everything was negative. Although we are concerned about her stiffness to her joints that lasts for about 2 hours in the morning, left medial epicondylar pain, some fatigue, there are no oral or nasal ulcers, no new rashes, no Achilles tendinitis, no nail pitting.  #6: Patient did have a skin biopsy of the right calf which shows psoriasis. We will watch and see if anything becomes of this.  #7: Patient was on a medication called Bruce Del 7.5 mg daily for menopause symptoms. She started the medication in August and finished the treatment in November. She is still symptom-free despite being off of the medication. This SSRI help the patient out significantly and I wonder if her osteoarthritic pain can be managed with Cymbalta. So we will try this medication and if the patient responds well after given her enough medication to continue it and then she'll last for refills if needed.   Orders: No orders of the defined types were placed in this encounter.  Meds ordered this encounter    Medications  . diclofenac sodium (VOLTAREN) 1 % GEL    Sig: Voltaren Gel 3 grams to 3 large joints upto TID 3 TUBES with 3 refills    Dispense:  3 Tube    Refill:  3    Voltaren Gel 3 grams to 3 large joints upto TID 3 TUBES with 3 refills    Order Specific Question:   Supervising Provider    Answer:   Bo Merino [[id number]]  . DULoxetine (CYMBALTA) 30 MG capsule    Sig: Take 1 capsule (30 mg total) by mouth daily.    Dispense:  20 capsule    Refill:  5    Order Specific Question:   Supervising Provider    Answer:   Bo Merino [[id number]]  . diclofenac sodium (VOLTAREN) 1 % GEL    Sig: Voltaren Gel 3 grams to 3 large joints upto TID 3 TUBES with 3 refills    Dispense:  3 Tube    Refill:  3    Voltaren Gel 3 grams to 3 large joints upto TID 3 TUBES with 3 refills    Order Specific Question:   Supervising Provider    Answer:   Lyda Perone    Face-to-face time spent with patient was 30 minutes. 50% of time was spent in counseling and coordination of care.  Follow-Up Instructions: Return in about 3 months (around 10/27/2016) for +ANA, joint pain, left medial epicondylitis, feet pain.   Eliezer Lofts, PA-C  Note - This record has been created using [employer].  Chart creation errors have been sought, but may not always  have been located. Such creation errors do not reflect on  the standard of medical care.

## 2016-08-23 NOTE — Progress Notes (Deleted)
Office Visit Note  Patient: Angela Washington             Date of Birth: 17-Dec-1965           MRN: [id number]             PCP: Wende Neighbors, MD Referring: Celene Squibb, MD Visit Date: 08/25/2016 Occupation: @GUAROCC @    Subjective:  No chief complaint on file.   History of Present Illness: Angela Washington is a 51 y.o. female ***   Activities of Daily Living:  Patient reports morning stiffness for *** {minute/hour:19697}.   Patient {ACTIONS;DENIES/REPORTS:21021675::"Denies"} nocturnal pain.  Difficulty dressing/grooming: {ACTIONS;DENIES/REPORTS:21021675::"Denies"} Difficulty climbing stairs: {ACTIONS;DENIES/REPORTS:21021675::"Denies"} Difficulty getting out of chair: {ACTIONS;DENIES/REPORTS:21021675::"Denies"} Difficulty using hands for taps, buttons, cutlery, and/or writing: {ACTIONS;DENIES/REPORTS:21021675::"Denies"}   No Rheumatology ROS completed.   PMFS History:  Patient Active Problem List   Diagnosis Date Noted  . S/P hysterectomy 08/22/2013  . RLQ abdominal pain 02/25/2012  . Ovarian torsion 02/25/2012  . Pain 06/30/2011    Past Medical History:  Diagnosis Date  . DVT (deep venous thrombosis) ([place])   . Headache(784.0)   . Hyperlipidemia 2008   diet controlled  . Hypertension   . 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 PONV (postoperative nausea and vomiting)   . Tachycardia     Family History  Problem Relation Age of Onset  . Parkinsonism Mother   . Heart attack Mother   . Heart attack Father   . Thyroid cancer Sister    Past Surgical History:  Procedure Laterality Date  . APPENDECTOMY    . BREAST REDUCTION SURGERY  2000  . [place]   2  . CHOLECYSTECTOMY    . COLON SURGERY     reduction  . LAPAROSCOPY  02/24/2012   Procedure: LAPAROSCOPY OPERATIVE;  Surgeon: Marvene Staff, MD;  Location: [facility];  Service: Gynecology;  Laterality: Right;  with Lyses of Adhesions  . lapor     Ovarian Cyst  and appedectomy  . ROBOTIC ASSISTED TOTAL HYSTERECTOMY N/A 08/22/2013   Procedure: ROBOTIC ASSISTED TOTAL HYSTERECTOMY/BILATERAL SALPINGECTOMY;  Surgeon: Marvene Staff, MD;  Location: [facility];  Service: Gynecology;  Laterality: N/A;  . SALPINGOOPHORECTOMY  02/24/2012   Procedure: SALPINGO OOPHERECTOMY;  Surgeon: Marvene Staff, MD;  Location: [facility];  Service: Gynecology;  Laterality: Right;  Right Salpingo-Oophorectomy; Left Salpingectomy  . TONSILLECTOMY    . TUBAL LIGATION     Social History   Social History Narrative  . No narrative on file     Objective: Vital Signs: LMP 08/10/2013 Comment: ablation   Physical Exam   Musculoskeletal Exam: ***  CDAI Exam: No CDAI exam completed.    Investigation: Findings:  03/15/2016 LA negative, beta 2 negative, anticardiolipin negative, ENA negative, C3-C4 normal CBC hemoglobin 15.6, CMP normal, CK 81, SPEP negative, immunoglobulins normal, UA negative    Imaging: No results found.  Speciality Comments: No specialty comments available.    Procedures:  No procedures performed Allergies: Morphine and related   Assessment / Plan:     Visit Diagnoses: No diagnosis found.    Orders: No orders of the defined types were placed in this encounter.  No orders of the defined types were placed in this encounter.   Face-to-face time spent with patient was *** minutes. 50% of time was spent in counseling and coordination of care.  Follow-Up Instructions: No Follow-up on file.   Bo Merino,  MD  Note - This record has been created using [employer].  Chart creation errors have been sought, but may not always  have been located. Such creation errors do not reflect on  the standard of medical care.

## 2016-08-25 ENCOUNTER — Ambulatory Visit: Payer: 59 | Admitting: Rheumatology

## 2016-10-21 DIAGNOSIS — M170 Bilateral primary osteoarthritis of knee: Secondary | ICD-10-CM | POA: Insufficient documentation

## 2016-10-21 DIAGNOSIS — M159 Polyosteoarthritis, unspecified: Secondary | ICD-10-CM | POA: Insufficient documentation

## 2016-10-21 DIAGNOSIS — M25571 Pain in right ankle and joints of right foot: Secondary | ICD-10-CM | POA: Insufficient documentation

## 2016-10-21 DIAGNOSIS — M7702 Medial epicondylitis, left elbow: Secondary | ICD-10-CM | POA: Insufficient documentation

## 2016-10-21 DIAGNOSIS — M359 Systemic involvement of connective tissue, unspecified: Secondary | ICD-10-CM | POA: Insufficient documentation

## 2016-10-21 DIAGNOSIS — L408 Other psoriasis: Secondary | ICD-10-CM | POA: Insufficient documentation

## 2016-10-21 DIAGNOSIS — M25572 Pain in left ankle and joints of left foot: Secondary | ICD-10-CM

## 2016-10-21 NOTE — Progress Notes (Deleted)
Office Visit Note  Patient: Angela Washington             Date of Birth: [birth year]           MRN: [id number]             PCP: Wende Neighbors, MD Referring: Celene Squibb, MD Visit Date: 10/27/2016 Occupation: @GUAROCC @    Subjective:  No chief complaint on file.   History of Present Illness: Angela Washington is a 51 y.o. female ***   Activities of Daily Living:  Patient reports morning stiffness for *** {minute/hour:19697}.   Patient {ACTIONS;DENIES/REPORTS:21021675::"Denies"} nocturnal pain.  Difficulty dressing/grooming: {ACTIONS;DENIES/REPORTS:21021675::"Denies"} Difficulty climbing stairs: {ACTIONS;DENIES/REPORTS:21021675::"Denies"} Difficulty getting out of chair: {ACTIONS;DENIES/REPORTS:21021675::"Denies"} Difficulty using hands for taps, buttons, cutlery, and/or writing: {ACTIONS;DENIES/REPORTS:21021675::"Denies"}   No Rheumatology ROS completed.   PMFS History:  Patient Active Problem List   Diagnosis Date Noted  . S/P hysterectomy 08/22/2013  . RLQ abdominal pain 02/25/2012  . Ovarian torsion 02/25/2012  . Pain 06/30/2011    Past Medical History:  Diagnosis Date  . DVT (deep venous thrombosis) ([place])   . Headache(784.0)   . Hyperlipidemia 2008   diet controlled  . Hypertension   . Peripheral vascular disease ([place]) 2008   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  . PONV (postoperative nausea and vomiting)   . Tachycardia     Family History  Problem Relation Age of Onset  . Parkinsonism Mother   . Heart attack Mother   . Heart attack Father   . Thyroid cancer Sister    Past Surgical History:  Procedure Laterality Date  . APPENDECTOMY    . BREAST REDUCTION SURGERY  2000  . [place]   2  . CHOLECYSTECTOMY    . COLON SURGERY     reduction  . LAPAROSCOPY  02/24/2012   Procedure: LAPAROSCOPY OPERATIVE;  Surgeon: Marvene Staff, MD;  Location: [facility];  Service: Gynecology;  Laterality: Right;  with Lyses of Adhesions  . lapor     Ovarian Cyst  and appedectomy  . ROBOTIC ASSISTED TOTAL HYSTERECTOMY N/A 08/22/2013   Procedure: ROBOTIC ASSISTED TOTAL HYSTERECTOMY/BILATERAL SALPINGECTOMY;  Surgeon: Marvene Staff, MD;  Location: [facility];  Service: Gynecology;  Laterality: N/A;  . SALPINGOOPHORECTOMY  02/24/2012   Procedure: SALPINGO OOPHERECTOMY;  Surgeon: Marvene Staff, MD;  Location: [facility];  Service: Gynecology;  Laterality: Right;  Right Salpingo-Oophorectomy; Left Salpingectomy  . TONSILLECTOMY    . TUBAL LIGATION     Social History   Social History Narrative  . No narrative on file     Objective: Vital Signs: LMP 08/10/2013 Comment: ablation   Physical Exam   Musculoskeletal Exam: ***  CDAI Exam: No CDAI exam completed.    Investigation: No additional findings. No visits with results within 6 Month(s) from this visit.  Latest known visit with results is:  Admission on 09/26/2014, Discharged on 09/26/2014  Component Date Value Ref Range Status  . Sodium 09/26/2014 139  135 - 145 mmol/L Final  . Potassium 09/26/2014 3.8  3.5 - 5.1 mmol/L Final  . Chloride 09/26/2014 99  96 - 112 mmol/L Final  . BUN 09/26/2014 13  6 - 23 mg/dL Final  . Creatinine, Ser 09/26/2014 0.80  0.50 - 1.10 mg/dL Final  . Glucose, Bld 09/26/2014 98  70 - 99 mg/dL Final  . Calcium, Ion 09/26/2014 1.09* 1.12 - 1.23 mmol/L Final  . TCO2 09/26/2014 25  0 - 100 mmol/L Final  .  Hemoglobin 09/26/2014 16.3* 12.0 - 15.0 g/dL Final  . HCT 09/26/2014 48.0* 36.0 - 46.0 % Final  . Prothrombin Time 09/26/2014 17.3* 11.6 - 15.2 seconds Final  . INR 09/26/2014 1.40  0.00 - 1.49 Final     Imaging: No results found.  Speciality Comments: No specialty comments available.    Procedures:  No procedures performed Allergies: Morphine and related   Assessment / Plan:     Visit Diagnoses: No diagnosis found.    Orders: No orders of the defined types were placed in this encounter.  No orders of the defined types were placed in this  encounter.   Face-to-face time spent with patient was *** minutes. 50% of time was spent in counseling and coordination of care.  Follow-Up Instructions: No Follow-up on file.   Cagney Degrace, [place]  Note - This record has been created using [employer].  Chart creation errors have been sought, but may not always  have been located. Such creation errors do not reflect on  the standard of medical care.

## 2016-10-27 ENCOUNTER — Ambulatory Visit: Payer: 59 | Admitting: Rheumatology

## 2017-01-02 ENCOUNTER — Emergency Department (HOSPITAL_COMMUNITY): Payer: 59

## 2017-01-02 ENCOUNTER — Encounter (HOSPITAL_COMMUNITY): Payer: Self-pay | Admitting: *Deleted

## 2017-01-02 ENCOUNTER — Emergency Department (HOSPITAL_COMMUNITY)
Admission: EM | Admit: 2017-01-02 | Discharge: 2017-01-02 | Disposition: A | Discharge status: Home / Self Care | Payer: 59 | Attending: Emergency Medicine | Admitting: Emergency Medicine

## 2017-01-02 DIAGNOSIS — Z79899 Other long term (current) drug therapy: Secondary | ICD-10-CM | POA: Insufficient documentation

## 2017-01-02 DIAGNOSIS — I10 Essential (primary) hypertension: Secondary | ICD-10-CM | POA: Diagnosis not present

## 2017-01-02 DIAGNOSIS — R0602 Shortness of breath: Secondary | ICD-10-CM | POA: Diagnosis not present

## 2017-01-02 DIAGNOSIS — R0789 Other chest pain: Secondary | ICD-10-CM | POA: Insufficient documentation

## 2017-01-02 DIAGNOSIS — R079 Chest pain, unspecified: Secondary | ICD-10-CM | POA: Diagnosis present

## 2017-01-02 HISTORY — DX: Hereditary deficiency of other clotting factors: D68.2

## 2017-01-02 LAB — CBC
HEMATOCRIT: 49.1 % — AB (ref 36.0–46.0)
Hemoglobin: 17.2 g/dL — ABNORMAL HIGH (ref 12.0–15.0)
MCH: 29.8 pg (ref 26.0–34.0)
MCHC: 35 g/dL (ref 30.0–36.0)
MCV: 85.1 fL (ref 78.0–100.0)
Platelets: 282 10*3/uL (ref 150–400)
RBC: 5.77 MIL/uL — ABNORMAL HIGH (ref 3.87–5.11)
RDW: 12.7 % (ref 11.5–15.5)
WBC: 10 10*3/uL (ref 4.0–10.5)

## 2017-01-02 LAB — BASIC METABOLIC PANEL
Anion gap: 14 (ref 5–15)
BUN: 15 mg/dL (ref 6–20)
CHLORIDE: 102 mmol/L (ref 101–111)
CO2: 27 mmol/L (ref 22–32)
Calcium: 10.1 mg/dL (ref 8.9–10.3)
Creatinine, Ser: 0.93 mg/dL (ref 0.44–1.00)
GFR calc Af Amer: >60 mL/min (ref >60)
GFR calc non Af Amer: >60 mL/min (ref >60)
GLUCOSE: 119 mg/dL — AB (ref 65–99)
Potassium: 4.1 mmol/L (ref 3.5–5.1)
Sodium: 143 mmol/L (ref 135–145)

## 2017-01-02 LAB — TROPONIN I: Troponin I: <0.03 ng/mL (ref <0.03)

## 2017-01-02 MED ORDER — OXYCODONE-ACETAMINOPHEN 5-325 MG PO TABS: ORAL_TABLET | ORAL | 0 refills | Status: DC

## 2017-01-02 MED ORDER — METHOCARBAMOL 500 MG PO TABS: 1000.0000 mg | ORAL_TABLET | Freq: Four times a day (QID) | ORAL | 0 refills | Status: DC | PRN

## 2017-01-02 MED ORDER — IOPAMIDOL (ISOVUE-370) INJECTION 76%
100.0000 mL | Freq: Once | INTRAVENOUS | Status: AC | PRN
  Administered 2017-01-02: 100 mL via INTRAVENOUS

## 2017-01-02 MED ORDER — CYCLOBENZAPRINE HCL 10 MG PO TABS
10.0000 mg | ORAL_TABLET | Freq: Once | ORAL | Status: AC
  Administered 2017-01-02: 10 mg via ORAL
  Filled 2017-01-02: qty 1

## 2017-01-02 MED ORDER — OXYCODONE-ACETAMINOPHEN 5-325 MG PO TABS
2.0000 | ORAL_TABLET | Freq: Once | ORAL | Status: AC
  Administered 2017-01-02: 2 via ORAL
  Filled 2017-01-02: qty 2

## 2017-01-02 MED ORDER — FENTANYL CITRATE (PF) 100 MCG/2ML IJ SOLN
50.0000 ug | INTRAMUSCULAR | Status: AC | PRN
  Administered 2017-01-02 (×2): 50 ug via INTRAVENOUS
  Filled 2017-01-02 (×2): qty 2

## 2017-01-02 NOTE — ED Notes (Signed)
Patient transported to CT 

## 2017-01-02 NOTE — Discharge Instructions (Signed)
Take the prescriptions as directed.  Apply moist heat or ice to the area(s) of discomfort, for 15 minutes at a time, several times per day for the next few days.  Do not fall asleep on a heating or ice pack.  Your CT scan showed incidental findings: "Right middle lobe pulmonary nodule is unchanged and can be presumed benign. Bilateral renal cysts. An upper pole right renal lesion is most consistent with a minimally complex cyst, when compared back to noncontrast exam from 03/06/2009." Call your regular medical doctor on Monday to schedule a follow up appointment in the next 2 to 3 days.  Return to the Emergency Department immediately if worsening.

## 2017-01-02 NOTE — ED Provider Notes (Signed)
[facility] DEPT Provider Note   CSN: [id number] Arrival date & time: 01/02/17  1832     History   Chief Complaint Chief Complaint  Patient presents with  . Chest Pain  . Shortness of Breath    HPI Angela Washington is a 51 y.o. female.   Chest Pain   Associated symptoms include shortness of breath.  Shortness of Breath  Associated symptoms include chest pain.    Pt was seen at 1900. Per pt, c/o sudden onset and persistence of constant right sided chest "pain" that began while riding in a car approximately 3 hours PTA. Pt states she "was yawning a lot" before the pain began. Describes the CP as "sharp," worsens with breathing.  Endorses hx DVT/PE, and has been taking eliquis as prescribed. Denies any other symptoms. Denies calf/LE pain or unilateral swelling, no palpitations, no cough, no abd pain, no N/V/D, no back pain, no fevers, no rash, no injury.   Past Medical History:  Diagnosis Date  . DVT (deep venous thrombosis) ([place])   . Headache(784.0)   . Hyperlipidemia 2008   diet controlled  . Hypertension   . Peripheral vascular disease ([place]) 2008   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  . PONV (postoperative nausea and vomiting)   . 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 Tachycardia     Patient Active Problem List   Diagnosis Date Noted  . Autoimmune disease ([place]) 10/21/2016  . Medial epicondylitis of elbow, left 10/21/2016  . Primary osteoarthritis of both knees 10/21/2016  . Degenerative joint disease involving multiple joints 10/21/2016  . Other psoriasis 10/21/2016  . Pain in joints of both feet 10/21/2016  . S/P hysterectomy 08/22/2013  . RLQ abdominal pain 02/25/2012  . Ovarian torsion 02/25/2012  . Pain 06/30/2011    Past Surgical History:  Procedure Laterality Date  . APPENDECTOMY    . BREAST REDUCTION SURGERY  2000  . [place]   2  . CHOLECYSTECTOMY    . COLON SURGERY     reduction  . LAPAROSCOPY  02/24/2012   Procedure: LAPAROSCOPY  OPERATIVE;  Surgeon: Marvene Staff, MD;  Location: [facility];  Service: Gynecology;  Laterality: Right;  with Lyses of Adhesions  . lapor     Ovarian Cyst and appedectomy  . ROBOTIC ASSISTED TOTAL HYSTERECTOMY N/A 08/22/2013   Procedure: ROBOTIC ASSISTED TOTAL HYSTERECTOMY/BILATERAL SALPINGECTOMY;  Surgeon: Marvene Staff, MD;  Location: [facility];  Service: Gynecology;  Laterality: N/A;  . SALPINGOOPHORECTOMY  02/24/2012   Procedure: SALPINGO OOPHERECTOMY;  Surgeon: Marvene Staff, MD;  Location: [facility];  Service: Gynecology;  Laterality: Right;  Right Salpingo-Oophorectomy; Left Salpingectomy  . TONSILLECTOMY    . TUBAL LIGATION      OB History    No data available       Home Medications    Prior to Admission medications   Medication Sig Start Date End Date Taking? Authorizing Provider  buPROPion (WELLBUTRIN XL) 150 MG 24 hr tablet Take 150 mg by mouth every morning.  07/21/16  Yes [provider]  cholecalciferol (VITAMIN D) 1000 UNITS tablet Take 2,000 Units by mouth daily. Pt takes 2,000 units daily.   Yes [provider]  ELIQUIS 2.5 MG TABS tablet Take 2.5 mg by mouth 2 (two) times daily.  07/09/16  Yes [provider]  metoprolol succinate (TOPROL-XL) 50 MG 24 hr tablet Take 50 mg by mouth at bedtime. Take with or immediately following a meal.   Yes  [provider]  rizatriptan (MAXALT) 10 MG tablet Take 10 mg by mouth as needed for migraine. May repeat in 2 hours if needed for migraines.   Yes [provider]    Family History Family History  Problem Relation Age of Onset  . Heart attack Father   . Parkinsonism Mother   . Heart attack Mother   . Thyroid cancer Sister     Social History Social History  Substance Use Topics  . Smoking status: Never Smoker  . Smokeless tobacco: Never Used  . Alcohol use No     Allergies   Morphine and related   Review of Systems Review of Systems  Respiratory: Positive  for shortness of breath.   Cardiovascular: Positive for chest pain.  ROS: Statement: All systems negative except as marked or noted in the HPI; Constitutional: Negative for fever and chills. ; ; Eyes: Negative for eye pain, redness and discharge. ; ; ENMT: Negative for ear pain, hoarseness, nasal congestion, sinus pressure and sore throat. ; ; Cardiovascular: Negative for palpitations, diaphoresis, dyspnea and peripheral edema. ; ; Respiratory: Negative for cough, wheezing and stridor. ; ; Gastrointestinal: Negative for nausea, vomiting, diarrhea, abdominal pain, blood in stool, hematemesis, jaundice and rectal bleeding. . ; ; Genitourinary: Negative for dysuria, flank pain and hematuria. ; ; Musculoskeletal: +right chest wall pain. Negative for back pain and neck pain. Negative for swelling and trauma.; ; Skin: Negative for pruritus, rash, abrasions, blisters, bruising and skin lesion.; ; Neuro: Negative for headache, lightheadedness and neck stiffness. Negative for weakness, altered level of consciousness, altered mental status, extremity weakness, paresthesias, involuntary movement, seizure and syncope.       Physical Exam Updated Vital Signs BP (!) 164/86 (BP Location: Left Arm)   Pulse 75   Temp 98.5 F (36.9 C) (Tympanic)   Resp (!) 22   Ht 5\' 4"  (1.626 m)   Wt 82.6 kg (182 lb)   LMP 08/10/2013 Comment: ablation  SpO2 99%   BMI 31.24 kg/m   Physical Exam 1905: Physical examination:  Nursing notes reviewed; Vital signs and O2 SAT reviewed;  Constitutional: Well developed, Well nourished, Well hydrated, Uncomfortable appearing.; Head:  Normocephalic, atraumatic; Eyes: EOMI, PERRL, No scleral icterus; ENMT: Mouth and pharynx normal, Mucous membranes moist; Neck: Supple, Full range of motion, No lymphadenopathy; Cardiovascular: Regular rate and rhythm, No gallop; Respiratory: Breath sounds clear & equal bilaterally, No wheezes.  Speaking full sentences with ease, Normal respiratory  effort/excursion; Chest: Nontender, Movement normal; Abdomen: Soft, Nontender, Nondistended, Normal bowel sounds; Genitourinary: No CVA tenderness; Extremities: Pulses normal, No tenderness, No edema, No calf edema or asymmetry.; Neuro: AA&Ox3, Major CN grossly intact.  Speech clear. No gross focal motor or sensory deficits in extremities.; Skin: Color normal, Warm, Dry.   ED Treatments / Results  Labs (all labs ordered are listed, but only abnormal results are displayed)   EKG  EKG Interpretation  Date/Time:  Sunday January 02 2017 18:44:58 EDT Ventricular Rate:  70 PR Interval:  184 QRS Duration: 76 QT Interval:  398 QTC Calculation: 429 R Axis:   8 Text Interpretation:  Normal sinus rhythm Baseline wander When compared with ECG of 06/20/2003 Rate slower Confirmed by [facility]  MD, Nunzio Cory [phone]) on 01/02/2017 6:48:50 PM       Radiology   Procedures Procedures (including critical care time)  Medications Ordered in ED Medications  fentaNYL (SUBLIMAZE) injection 50 mcg (50 mcg Intravenous Given 01/02/17 1926)     Initial Impression / Assessment and  Plan / ED Course  I have reviewed the triage vital signs and the nursing notes.  Pertinent labs & imaging results that were available during my care of the patient were reviewed by me and considered in my medical decision making (see chart for details).  MDM Reviewed: previous chart, vitals and nursing note Reviewed previous: labs and ECG Interpretation: labs, ECG, x-ray and CT scan   Results for orders placed or performed during the hospital encounter of 99/35/70  Basic metabolic panel  Result Value Ref Range   Sodium 143 135 - 145 mmol/L   Potassium 4.1 3.5 - 5.1 mmol/L   Chloride 102 101 - 111 mmol/L   CO2 27 22 - 32 mmol/L   Glucose, Bld 119 (H) 65 - 99 mg/dL   BUN 15 6 - 20 mg/dL   Creatinine, Ser 0.93 0.44 - 1.00 mg/dL   Calcium 10.1 8.9 - 10.3 mg/dL   GFR calc non Af Amer >60 >60 mL/min   GFR calc Af Amer >60 >60  mL/min   Anion gap 14 5 - 15  CBC  Result Value Ref Range   WBC 10.0 4.0 - 10.5 K/uL   RBC 5.77 (H) 3.87 - 5.11 MIL/uL   Hemoglobin 17.2 (H) 12.0 - 15.0 g/dL   HCT 49.1 (H) 36.0 - 46.0 %   MCV 85.1 78.0 - 100.0 fL   MCH 29.8 26.0 - 34.0 pg   MCHC 35.0 30.0 - 36.0 g/dL   RDW 12.7 11.5 - 15.5 %   Platelets 282 150 - 400 K/uL  Troponin I  Result Value Ref Range   Troponin I <0.03 <0.03 ng/mL   Dg Chest 2 View Result Date: 01/02/2017 CLINICAL DATA:  Right-sided chest pain EXAM: CHEST  2 VIEW COMPARISON:  09/26/2014 FINDINGS: No focal consolidation or effusion. Mild hazy bibasilar opacity may reflect atelectasis. There is mild cardiomegaly. There is no pneumothorax. IMPRESSION: 1. Mild cardiomegaly without edema 2. Hazy bibasilar opacity, may reflect mild atelectasis, at least some of the bibasilar opacity is likely attributed to overlying soft tissues. Electronically Signed   By: Donavan Foil M.D.   On: 01/02/2017 19:03   Ct Angio Chest Pe W/cm &/or Wo Cm Result Date: 01/02/2017 CLINICAL DATA:  Right-sided chest pain starting 2 hours ago. Shortness of breath. History of pulmonary embolism, on blood thinner. EXAM: CT ANGIOGRAPHY CHEST WITH CONTRAST TECHNIQUE: Multidetector CT imaging of the chest was performed using the standard protocol during bolus administration of intravenous contrast. Multiplanar CT image reconstructions and MIPs were obtained to evaluate the vascular anatomy. CONTRAST:  100 cc of Isovue 370 COMPARISON:  Chest radiograph 01/02/2017.  CT 09/26/2014. FINDINGS: Cardiovascular: The quality of this exam for evaluation of pulmonary embolism is good. No evidence of pulmonary embolism. The Normal aortic caliber without dissection. Mild cardiomegaly, without pericardial effusion. Mediastinum/Nodes: No supraclavicular adenopathy. No mediastinal or hilar adenopathy. Lungs/Pleura: No pleural fluid. 3 mm right middle lobe pulmonary nodule is unchanged on image 52/series 6. Subsegmental  atelectasis involves both lower lobes. Upper Abdomen: Normal imaged portions of the liver, spleen, stomach, pancreas, adrenal glands. Cholecystectomy. The common duct is dilated, including at 15 mm on coronal image 69/series 9. This is similar back to 09/26/2014. Tapers gradually in the region of the pancreatic head. An upper pole right renal lesion measures 6.7 cm and slightly greater than fluid density on image 91/series 4. 6.6 cm at the same level on the prior exam. This measured 20 HU and 5.6 cm on the noncontrast exam  of 03/06/2009. Other smaller bilateral renal lesions are again identified. The larger lesions are consistent with cysts. Some lesions are too small to characterize. Musculoskeletal: No acute osseous abnormality. Review of the MIP images confirms the above findings. IMPRESSION: 1.  No evidence of pulmonary embolism. 2.  No acute process in the chest. 3. Right middle lobe pulmonary nodule is unchanged and can be presumed benign. 4. Bilateral renal cysts. An upper pole right renal lesion is most consistent with a minimally complex cyst, when compared back to noncontrast exam from 03/06/2009. 5. Cholecystectomy with similar common duct dilatation. Most likely within normal variation, given chronicity. This could be correlated with bilirubin level. Electronically Signed   By: Abigail Miyamoto M.D.   On: 01/02/2017 21:50    2210:  CT reassuring. Likely msk pain. Doubt PE as cause for symptoms with normal CT-A chest. Doubt ACS as cause for symptoms with normal troponin and unchanged EKG from previous, but will obtain 2nd troponin. Pt in agreement with this plan. Will medicate for pain. Sign out to Dr. Lacinda Axon.     Final Clinical Impressions(s) / ED Diagnoses   Final diagnoses:  None    New Prescriptions New Prescriptions   No medications on file      Francine Graven, DO 01/05/17 4401

## 2017-01-02 NOTE — ED Triage Notes (Addendum)
Pt c/o right sided chest pain with no radiation that started about 2 hours ago. Denies nausea, vomiting, dizziness, lightheadedness, diaphoresis. Pt reports SOB. Pt reports the pain is worse with breathing. Hx of PE, pt on Eliquis.

## 2017-01-02 NOTE — ED Notes (Signed)
Pt ambulatory to waiting room. Pt verbalized understanding of discharge instructions.   

## 2017-01-05 ENCOUNTER — Other Ambulatory Visit (HOSPITAL_COMMUNITY): Payer: Self-pay | Admitting: Internal Medicine

## 2017-01-05 DIAGNOSIS — Z86718 Personal history of other venous thrombosis and embolism: Secondary | ICD-10-CM

## 2017-01-05 DIAGNOSIS — M7989 Other specified soft tissue disorders: Secondary | ICD-10-CM

## 2017-01-06 ENCOUNTER — Ambulatory Visit (HOSPITAL_COMMUNITY)
Admission: RE | Admit: 2017-01-06 | Discharge: 2017-01-06 | Disposition: A | Discharge status: Home / Self Care | Payer: 59 | Source: Ambulatory Visit | Attending: Internal Medicine | Admitting: Internal Medicine

## 2017-01-06 DIAGNOSIS — Z86718 Personal history of other venous thrombosis and embolism: Secondary | ICD-10-CM | POA: Insufficient documentation

## 2017-01-06 DIAGNOSIS — R2242 Localized swelling, mass and lump, left lower limb: Secondary | ICD-10-CM | POA: Diagnosis not present

## 2017-01-06 DIAGNOSIS — M7989 Other specified soft tissue disorders: Secondary | ICD-10-CM

## 2017-03-02 ENCOUNTER — Other Ambulatory Visit: Payer: Self-pay | Admitting: Obstetrics and Gynecology

## 2017-03-04 ENCOUNTER — Other Ambulatory Visit: Payer: Self-pay | Admitting: Gastroenterology

## 2017-03-04 DIAGNOSIS — R7989 Other specified abnormal findings of blood chemistry: Secondary | ICD-10-CM

## 2017-03-04 DIAGNOSIS — R945 Abnormal results of liver function studies: Principal | ICD-10-CM

## 2017-03-04 NOTE — Progress Notes (Signed)
Riddhi Grether MD 

## 2017-03-09 ENCOUNTER — Ambulatory Visit (HOSPITAL_COMMUNITY)
Admission: RE | Admit: 2017-03-09 | Discharge: 2017-03-09 | Disposition: A | Discharge status: Home / Self Care | Payer: 59 | Source: Ambulatory Visit | Attending: Gastroenterology | Admitting: Gastroenterology

## 2017-03-09 DIAGNOSIS — K838 Other specified diseases of biliary tract: Secondary | ICD-10-CM | POA: Diagnosis not present

## 2017-03-09 DIAGNOSIS — R7989 Other specified abnormal findings of blood chemistry: Secondary | ICD-10-CM | POA: Insufficient documentation

## 2017-03-09 DIAGNOSIS — R945 Abnormal results of liver function studies: Secondary | ICD-10-CM

## 2017-03-09 DIAGNOSIS — R799 Abnormal finding of blood chemistry, unspecified: Secondary | ICD-10-CM | POA: Diagnosis present

## 2017-03-09 DIAGNOSIS — Z9049 Acquired absence of other specified parts of digestive tract: Secondary | ICD-10-CM | POA: Diagnosis not present

## 2017-03-09 DIAGNOSIS — R932 Abnormal findings on diagnostic imaging of liver and biliary tract: Secondary | ICD-10-CM | POA: Insufficient documentation

## 2017-03-31 ENCOUNTER — Other Ambulatory Visit: Payer: Self-pay | Admitting: Family Medicine

## 2017-04-01 ENCOUNTER — Other Ambulatory Visit: Payer: Self-pay | Admitting: Family Medicine

## 2017-04-18 ENCOUNTER — Other Ambulatory Visit: Payer: Self-pay | Admitting: Family Medicine

## 2017-04-18 DIAGNOSIS — H547 Unspecified visual loss: Secondary | ICD-10-CM

## 2017-04-18 DIAGNOSIS — R442 Other hallucinations: Secondary | ICD-10-CM

## 2017-05-08 NOTE — Progress Notes (Signed)
Office Visit Note  Patient: Angela Washington             Date of Birth: [birth year]           MRN: [id number]             PCP: Hayden Rasmussen, MD Referring: Celene Squibb, MD Visit Date: 05/20/2017 Occupation: @GUAROCC @    Subjective:  Left shoulder pain.   History of Present Illness: Angela Washington is a 51 y.o. female with history of osteoarthritis and arthralgias. She states the pain moves around. Her trochanteric bursitis is better after doing some exercises. She's been having some discomfort in her left shoulder. Her  knee joints are doing better currently.  Activities of Daily Living:  Patient reports morning stiffness for 30 minutes.   Patient Reports nocturnal pain.  Difficulty dressing/grooming: Denies Difficulty climbing stairs: Denies Difficulty getting out of chair: Denies Difficulty using hands for taps, buttons, cutlery, and/or writing: Denies   Review of Systems  Constitutional: Positive for fatigue. Negative for night sweats, weight gain, weight loss and weakness.  HENT: Negative.  Negative for mouth sores, trouble swallowing, trouble swallowing, mouth dryness and nose dryness.   Eyes: Negative.  Negative for pain, redness, visual disturbance and dryness.  Respiratory: Negative.  Negative for cough, shortness of breath and difficulty breathing.   Cardiovascular: Negative.  Negative for chest pain, palpitations, hypertension, irregular heartbeat and swelling in legs/feet.  Gastrointestinal: Negative.  Negative for blood in stool, constipation and diarrhea.  Endocrine: Negative for increased urination.  Genitourinary: Negative for vaginal dryness.  Musculoskeletal: Positive for arthralgias, joint pain and morning stiffness. Negative for joint swelling, myalgias, muscle weakness, muscle tenderness and myalgias.  Skin: Negative.  Negative for color change, rash, hair loss, skin tightness, ulcers and sensitivity to sunlight.  Allergic/Immunologic: Negative for  susceptible to infections.  Neurological: Negative.  Negative for dizziness, numbness, headaches, memory loss and night sweats.  Hematological: Negative for swollen glands.  Psychiatric/Behavioral: Negative.  Negative for depressed mood and sleep disturbance. The patient is not nervous/anxious.     PMFS History:  Patient Active Problem List   Diagnosis Date Noted  . ANA positive 05/19/2017  . Family history of lupus erythematosus 05/19/2017  . History of hypertension 05/19/2017  . History of hypercholesterolemia 05/19/2017  . History of IBS 05/19/2017  . History of migraine 05/19/2017  . SVT (supraventricular tachycardia) ([place]) 05/19/2017  . 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 Primary osteoarthritis of both knees 10/21/2016  . Other psoriasis 10/21/2016  . Pain in joints of both feet 10/21/2016  . S/P hysterectomy 08/22/2013  . RLQ abdominal pain 02/25/2012  . Ovarian torsion 02/25/2012    Past Medical History:  Diagnosis Date  . DVT (deep venous thrombosis) ([place])   . Headache(784.0)   . Hyperlipidemia 2008   diet controlled  . Hypertension   . Peripheral vascular disease ([place]) 2008   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  . PONV (postoperative nausea and vomiting)   . 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[place])   . Tachycardia     Family History  Problem Relation Age of Onset  . Heart attack Father   . Parkinsonism Mother   . Heart attack Mother   . Thyroid cancer Sister    Past Surgical History:  Procedure Laterality Date  . APPENDECTOMY    . BREAST REDUCTION SURGERY  2000  . [place]   2  . CHOLECYSTECTOMY    . COLON SURGERY  reduction  . LAPAROSCOPY  02/24/2012   Procedure: LAPAROSCOPY OPERATIVE;  Surgeon: Marvene Staff, MD;  Location: [facility];  Service: Gynecology;  Laterality: Right;  with Lyses of Adhesions  . lapor     Ovarian Cyst and appedectomy  . ROBOTIC ASSISTED TOTAL HYSTERECTOMY N/A 08/22/2013   Procedure: ROBOTIC ASSISTED TOTAL  HYSTERECTOMY/BILATERAL SALPINGECTOMY;  Surgeon: Marvene Staff, MD;  Location: [facility];  Service: Gynecology;  Laterality: N/A;  . SALPINGOOPHORECTOMY  02/24/2012   Procedure: SALPINGO OOPHERECTOMY;  Surgeon: Marvene Staff, MD;  Location: [facility];  Service: Gynecology;  Laterality: Right;  Right Salpingo-Oophorectomy; Left Salpingectomy  . TONSILLECTOMY    . TUBAL LIGATION     Social History   Social History Narrative  . No narrative on file     Objective: Vital Signs: BP 135/86 (BP Location: Left Arm, Patient Position: Sitting, Cuff Size: Normal)   Pulse 70   Ht 5\' 4"  (1.626 m)   Wt 186 lb (84.4 kg)   LMP 08/10/2013 Comment: ablation  BMI 31.93 kg/m    Physical Exam  Constitutional: She is oriented to person, place, and time. She appears well-developed and well-nourished.  HENT:  Head: Normocephalic and atraumatic.  Eyes: Conjunctivae and EOM are normal.  Neck: Normal range of motion.  Cardiovascular: Normal rate, regular rhythm, normal heart sounds and intact distal pulses.   Pulmonary/Chest: Effort normal and breath sounds normal.  Abdominal: Soft. Bowel sounds are normal.  Lymphadenopathy:    She has no cervical adenopathy.  Neurological: She is alert and oriented to person, place, and time.  Skin: Skin is warm and dry. Capillary refill takes less than 2 seconds.  Psychiatric: She has a normal mood and affect. Her behavior is normal.  Nursing note and vitals reviewed.    Musculoskeletal Exam: C-spine and thoracic lumbar spine good range of motion. Shoulder joints elbow joints wrist joint MCPs PIPs DIPs with good range of motion. She is some discomfort with raising her left shoulder and tenderness over her subacromial bursa. Hip joints knee joints ankles MTPs PIPs DIPs with good range of motion.  CDAI Exam: No CDAI exam completed.    Investigation: No additional findings.  CBC Latest Ref Rng & Units 01/02/2017 09/26/2014 08/23/2013  WBC 4.0 - 10.5 K/uL 10.0 -  18.3(H)  Hemoglobin 12.0 - 15.0 g/dL 17.2(H) 16.3(H) 12.6  Hematocrit 36.0 - 46.0 % 49.1(H) 48.0(H) 36.9  Platelets 150 - 400 K/uL 282 - 234   CMP Latest Ref Rng & Units 01/02/2017 09/26/2014 08/23/2013  Glucose 65 - 99 mg/dL 119(H) 98 157(H)  BUN 6 - 20 mg/dL 15 13 9   Creatinine 0.44 - 1.00 mg/dL 0.93 0.80 0.76  Sodium 135 - 145 mmol/L 143 139 138  Potassium 3.5 - 5.1 mmol/L 4.1 3.8 4.2  Chloride 101 - 111 mmol/L 102 99 101  CO2 22 - 32 mmol/L 27 - 29  Calcium 8.9 - 10.3 mg/dL 10.1 - 8.4  Total Protein - - - -  Total Bilirubin - - - -  Alkaline Phos - - - -  AST - - - -  ALT - - - -   Imaging: Mr Jeri Cos Wo Contrast  Result Date: 05/13/2017 CLINICAL DATA:  Olfactory hallucination. Visual deficits. Patient sometimes smells smoker. Symptoms for 6-9 months. EXAM: MRI HEAD WITHOUT AND WITH CONTRAST TECHNIQUE: Multiplanar, multiecho pulse sequences of the brain and surrounding structures were obtained without and with intravenous contrast. CONTRAST:  34mL MULTIHANCE GADOBENATE DIMEGLUMINE 529 MG/ML IV SOLN COMPARISON:  Head CT 09/26/2014 FINDINGS: Brain: No explanation for symptoms. Thin-section imaging was not acquired. Clear olfactory recesses. Symmetric patency of the nasal cavity. No evidence of brain mass or encephalomalacia. No abnormal intracranial enhancement. Brain volume is normal. No evidence of demyelinating disease. No infarct, hemorrhage, or hydrocephalus. Vascular: Major flow voids are preserved. Skull and upper cervical spine: Negative for marrow lesion. Degenerative spurring seen at the right TMJ and left C3-4 facet. Sinuses/Orbits: Question 8 mm polyp from the anterior aspect of the left middle turbinate. Negative orbits. No active sinusitis. IMPRESSION: 1. Normal appearance of the brain.  No explanation for symptoms. 2. Question small polyp from anterior aspect of the left middle turbinate. Please correlate with nasal endoscopy. Electronically Signed   By: Monte Fantasia M.D.    On: 05/13/2017 11:20   [place] Extrem Up Right Comp  Result Date: 05/11/2017 Ultrasound examination of her right hand was performed per EULAR recommendations. Using 12 MHz transducer, grayscale and power Doppler first, second MCP and second, third, and fifth PIP, first, second and fourth DIP joints and  wrist joint both dorsal and volar aspects were evaluated to look for synovitis or tenosynovitis. The findings were there was no synovitis or tenosynovitis on ultrasound examination. Right median nerve was 0.15 cm squares which was more than upper limits of normal. Impression: Ultrasound examination of the right hand did not show any synovitis. Right median nerve was enlarged. Patient did not have any symptoms of carpal tunnel syndrome.   Speciality Comments: No specialty comments available.    Procedures:  No procedures performed Allergies: Morphine and related   Assessment / Plan:     Visit Diagnoses: ANA positive - 1:320 NS, no clinical features of autoimmune disease. She will contact us in case she develops any new symptoms. Otherwise I will see her back in one year. I will repeat her ANA titer, C3-C4, dsDNA prior to her next visit.  Acute pain of left shoulder: She appears to have some subacromial bursitis. I've given her handout on exercises. She can also use Voltaren gel.  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 She will continue to exercise.  History of insomnia  History of hypertension: Her blood pressure is mildly elevated.  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 She is on Eliquis.  Family history of lupus erythematosus - maternal aunt    Orders: No orders of the  defined types were placed in this encounter.  No orders of the defined types were placed in this encounter.   Follow-Up Instructions: Return in about 1 year (around 05/20/2018) for OA +ANA.   Bo Merino, MD  Note - This record has been created using Editor, commissioning.  Chart creation errors have been sought, but may not always  have been located. Such creation errors do not reflect on  the standard of medical care.

## 2017-05-10 NOTE — Progress Notes (Signed)
Patient has history of positive ANA. All other autoimmune workup was negative. She also has history of osteoarthritis. Her skin biopsy recently was positive for psoriasis. She is a scheduled to have ultrasound of bilateral hands to rule out synovitis.

## 2017-05-11 ENCOUNTER — Inpatient Hospital Stay (INDEPENDENT_AMBULATORY_CARE_PROVIDER_SITE_OTHER): Payer: 59

## 2017-05-11 ENCOUNTER — Ambulatory Visit (INDEPENDENT_AMBULATORY_CARE_PROVIDER_SITE_OTHER): Payer: 59 | Admitting: Rheumatology

## 2017-05-11 DIAGNOSIS — M79641 Pain in right hand: Secondary | ICD-10-CM

## 2017-05-11 DIAGNOSIS — M79642 Pain in left hand: Secondary | ICD-10-CM

## 2017-05-13 ENCOUNTER — Ambulatory Visit
Admission: RE | Admit: 2017-05-13 | Discharge: 2017-05-13 | Disposition: A | Discharge status: Home / Self Care | Payer: 59 | Source: Ambulatory Visit | Attending: Family Medicine | Admitting: Family Medicine

## 2017-05-13 DIAGNOSIS — H547 Unspecified visual loss: Secondary | ICD-10-CM

## 2017-05-13 DIAGNOSIS — R442 Other hallucinations: Secondary | ICD-10-CM

## 2017-05-13 MED ORDER — GADOBENATE DIMEGLUMINE 529 MG/ML IV SOLN
17.0000 mL | Freq: Once | INTRAVENOUS | Status: AC | PRN
  Administered 2017-05-13: 17 mL via INTRAVENOUS

## 2017-05-19 DIAGNOSIS — Z8719 Personal history of other diseases of the digestive system: Secondary | ICD-10-CM | POA: Insufficient documentation

## 2017-05-19 DIAGNOSIS — D6852 Prothrombin gene mutation: Secondary | ICD-10-CM | POA: Insufficient documentation

## 2017-05-19 DIAGNOSIS — Z8679 Personal history of other diseases of the circulatory system: Secondary | ICD-10-CM | POA: Insufficient documentation

## 2017-05-19 DIAGNOSIS — Z8639 Personal history of other endocrine, nutritional and metabolic disease: Secondary | ICD-10-CM | POA: Insufficient documentation

## 2017-05-19 DIAGNOSIS — R768 Other specified abnormal immunological findings in serum: Secondary | ICD-10-CM | POA: Insufficient documentation

## 2017-05-19 DIAGNOSIS — Z840 Family history of diseases of the skin and subcutaneous tissue: Secondary | ICD-10-CM | POA: Insufficient documentation

## 2017-05-19 DIAGNOSIS — Z8669 Personal history of other diseases of the nervous system and sense organs: Secondary | ICD-10-CM | POA: Insufficient documentation

## 2017-05-19 DIAGNOSIS — I471 Supraventricular tachycardia: Secondary | ICD-10-CM | POA: Insufficient documentation

## 2017-05-20 ENCOUNTER — Encounter: Payer: Self-pay | Admitting: Rheumatology

## 2017-05-20 ENCOUNTER — Ambulatory Visit (INDEPENDENT_AMBULATORY_CARE_PROVIDER_SITE_OTHER): Payer: 59 | Admitting: Rheumatology

## 2017-05-20 VITALS — BP 135/86 | HR 70 | Ht 64.0 in | Wt 186.0 lb

## 2017-05-20 DIAGNOSIS — Z9049 Acquired absence of other specified parts of digestive tract: Secondary | ICD-10-CM | POA: Diagnosis not present

## 2017-05-20 DIAGNOSIS — R768 Other specified abnormal immunological findings in serum: Secondary | ICD-10-CM

## 2017-05-20 DIAGNOSIS — Z8679 Personal history of other diseases of the circulatory system: Secondary | ICD-10-CM | POA: Diagnosis not present

## 2017-05-20 DIAGNOSIS — D6852 Prothrombin gene mutation: Secondary | ICD-10-CM | POA: Diagnosis not present

## 2017-05-20 DIAGNOSIS — M25512 Pain in left shoulder: Secondary | ICD-10-CM | POA: Diagnosis not present

## 2017-05-20 DIAGNOSIS — Z840 Family history of diseases of the skin and subcutaneous tissue: Secondary | ICD-10-CM | POA: Diagnosis not present

## 2017-05-20 DIAGNOSIS — Z8669 Personal history of other diseases of the nervous system and sense organs: Secondary | ICD-10-CM

## 2017-05-20 DIAGNOSIS — Z8639 Personal history of other endocrine, nutritional and metabolic disease: Secondary | ICD-10-CM

## 2017-05-20 DIAGNOSIS — Z8719 Personal history of other diseases of the digestive system: Secondary | ICD-10-CM

## 2017-05-20 DIAGNOSIS — M170 Bilateral primary osteoarthritis of knee: Secondary | ICD-10-CM

## 2017-05-20 DIAGNOSIS — I471 Supraventricular tachycardia, unspecified: Secondary | ICD-10-CM

## 2017-05-20 DIAGNOSIS — Z87898 Personal history of other specified conditions: Secondary | ICD-10-CM

## 2017-05-20 NOTE — Patient Instructions (Signed)
Shoulder Exercises Ask your health care provider which exercises are safe for you. Do exercises exactly as told by your health care provider and adjust them as directed. It is normal to feel mild stretching, pulling, tightness, or discomfort as you do these exercises, but you should stop right away if you feel sudden pain or your pain gets worse.Do not begin these exercises until told by your health care provider. RANGE OF MOTION EXERCISES These exercises warm up your muscles and joints and improve the movement and flexibility of your shoulder. These exercises also help to relieve pain, numbness, and tingling. These exercises involve stretching your injured shoulder directly. Exercise A: Pendulum  1. Stand near a wall or a surface that you can hold onto for balance. 2. Bend at the waist and let your left / right arm hang straight down. Use your other arm to support you. Keep your back straight and do not lock your knees. 3. Relax your left / right arm and shoulder muscles, and move your hips and your trunk so your left / right arm swings freely. Your arm should swing because of the motion of your body, not because you are using your arm or shoulder muscles. 4. Keep moving your body so your arm swings in the following directions, as told by your health care provider: ? Side to side. ? Forward and backward. ? In clockwise and counterclockwise circles. 5. Continue each motion for __________ seconds, or for as long as told by your health care provider. 6. Slowly return to the starting position. Repeat __________ times. Complete this exercise __________ times a day. Exercise B:Flexion, Standing  1. Stand and hold a broomstick, a cane, or a similar object. Place your hands a little more than shoulder-width apart on the object. Your left / right hand should be palm-up, and your other hand should be palm-down. 2. Keep your elbow straight and keep your shoulder muscles relaxed. Push the stick down with  your healthy arm to raise your left / right arm in front of your body, and then over your head until you feel a stretch in your shoulder. ? Avoid shrugging your shoulder while you raise your arm. Keep your shoulder blade tucked down toward the middle of your back. 3. Hold for __________ seconds. 4. Slowly return to the starting position. Repeat __________ times. Complete this exercise __________ times a day. Exercise C: Abduction, Standing 1. Stand and hold a broomstick, a cane, or a similar object. Place your hands a little more than shoulder-width apart on the object. Your left / right hand should be palm-up, and your other hand should be palm-down. 2. While keeping your elbow straight and your shoulder muscles relaxed, push the stick across your body toward your left / right side. Raise your left / right arm to the side of your body and then over your head until you feel a stretch in your shoulder. ? Do not raise your arm above shoulder height, unless your health care provider tells you to do that. ? Avoid shrugging your shoulder while you raise your arm. Keep your shoulder blade tucked down toward the middle of your back. 3. Hold for __________ seconds. 4. Slowly return to the starting position. Repeat __________ times. Complete this exercise __________ times a day. Exercise D:Internal Rotation  1. Place your left / right hand behind your back, palm-up. 2. Use your other hand to dangle an exercise band, a towel, or a similar object over your shoulder. Grasp the band with   your left / right hand so you are holding onto both ends. 3. Gently pull up on the band until you feel a stretch in the front of your left / right shoulder. ? Avoid shrugging your shoulder while you raise your arm. Keep your shoulder blade tucked down toward the middle of your back. 4. Hold for __________ seconds. 5. Release the stretch by letting go of the band and lowering your hands. Repeat __________ times. Complete  this exercise __________ times a day. STRETCHING EXERCISES These exercises warm up your muscles and joints and improve the movement and flexibility of your shoulder. These exercises also help to relieve pain, numbness, and tingling. These exercises are done using your healthy shoulder to help stretch the muscles of your injured shoulder. Exercise E: Corner Stretch (External Rotation and Abduction)  1. Stand in a doorway with one of your feet slightly in front of the other. This is called a staggered stance. If you cannot reach your forearms to the door frame, stand facing a corner of a room. 2. Choose one of the following positions as told by your health care provider: ? Place your hands and forearms on the door frame above your head. ? Place your hands and forearms on the door frame at the height of your head. ? Place your hands on the door frame at the height of your elbows. 3. Slowly move your weight onto your front foot until you feel a stretch across your chest and in the front of your shoulders. Keep your head and chest upright and keep your abdominal muscles tight. 4. Hold for __________ seconds. 5. To release the stretch, shift your weight to your back foot. Repeat __________ times. Complete this stretch __________ times a day. Exercise F:Extension, Standing 1. Stand and hold a broomstick, a cane, or a similar object behind your back. ? Your hands should be a little wider than shoulder-width apart. ? Your palms should face away from your back. 2. Keeping your elbows straight and keeping your shoulder muscles relaxed, move the stick away from your body until you feel a stretch in your shoulder. ? Avoid shrugging your shoulders while you move the stick. Keep your shoulder blade tucked down toward the middle of your back. 3. Hold for __________ seconds. 4. Slowly return to the starting position. Repeat __________ times. Complete this exercise __________ times a day. STRENGTHENING  EXERCISES These exercises build strength and endurance in your shoulder. Endurance is the ability to use your muscles for a long time, even after they get tired. Exercise G:External Rotation  1. Sit in a stable chair without armrests. 2. Secure an exercise band at elbow height on your left / right side. 3. Place a soft object, such as a folded towel or a small pillow, between your left / right upper arm and your body to move your elbow a few inches away (about 10 cm) from your side. 4. Hold the end of the band so it is tight and there is no slack. 5. Keeping your elbow pressed against the soft object, move your left / right forearm out, away from your abdomen. Keep your body steady so only your forearm moves. 6. Hold for __________ seconds. 7. Slowly return to the starting position. Repeat __________ times. Complete this exercise __________ times a day. Exercise H:Shoulder Abduction  1. Sit in a stable chair without armrests, or stand. 2. Hold a __________ weight in your left / right hand, or hold an exercise band with both hands.   3. Start with your arms straight down and your left / right palm facing in, toward your body. 4. Slowly lift your left / right hand out to your side. Do not lift your hand above shoulder height unless your health care provider tells you that this is safe. ? Keep your arms straight. ? Avoid shrugging your shoulder while you do this movement. Keep your shoulder blade tucked down toward the middle of your back. 5. Hold for __________ seconds. 6. Slowly lower your arm, and return to the starting position. Repeat __________ times. Complete this exercise __________ times a day. Exercise I:Shoulder Extension 1. Sit in a stable chair without armrests, or stand. 2. Secure an exercise band to a stable object in front of you where it is at shoulder height. 3. Hold one end of the exercise band in each hand. Your palms should face each other. 4. Straighten your elbows and  lift your hands up to shoulder height. 5. Step back, away from the secured end of the exercise band, until the band is tight and there is no slack. 6. Squeeze your shoulder blades together as you pull your hands down to the sides of your thighs. Stop when your hands are straight down by your sides. Do not let your hands go behind your body. 7. Hold for __________ seconds. 8. Slowly return to the starting position. Repeat __________ times. Complete this exercise __________ times a day. Exercise J:Standing Shoulder Row 1. Sit in a stable chair without armrests, or stand. 2. Secure an exercise band to a stable object in front of you so it is at waist height. 3. Hold one end of the exercise band in each hand. Your palms should be in a thumbs-up position. 4. Bend each of your elbows to an "L" shape (about 90 degrees) and keep your upper arms at your sides. 5. Step back until the band is tight and there is no slack. 6. Slowly pull your elbows back behind you. 7. Hold for __________ seconds. 8. Slowly return to the starting position. Repeat __________ times. Complete this exercise __________ times a day. Exercise K:Shoulder Press-Ups  1. Sit in a stable chair that has armrests. Sit upright, with your feet flat on the floor. 2. Put your hands on the armrests so your elbows are bent and your fingers are pointing forward. Your hands should be about even with the sides of your body. 3. Push down on the armrests and use your arms to lift yourself off of the chair. Straighten your elbows and lift yourself up as much as you comfortably can. ? Move your shoulder blades down, and avoid letting your shoulders move up toward your ears. ? Keep your feet on the ground. As you get stronger, your feet should support less of your body weight as you lift yourself up. 4. Hold for __________ seconds. 5. Slowly lower yourself back into the chair. Repeat __________ times. Complete this exercise __________ times a  day. Exercise L: Wall Push-Ups  1. Stand so you are facing a stable wall. Your feet should be about one arm-length away from the wall. 2. Lean forward and place your palms on the wall at shoulder height. 3. Keep your feet flat on the floor as you bend your elbows and lean forward toward the wall. 4. Hold for __________ seconds. 5. Straighten your elbows to push yourself back to the starting position. Repeat __________ times. Complete this exercise __________ times a day. This information is not intended to replace advice   given to you by your health care provider. Make sure you discuss any questions you have with your health care provider. Document Released: 05/26/2005 Document Revised: 04/05/2016 Document Reviewed: 03/23/2015 [employer] Interactive Patient Education  2018 [employer]  

## 2017-11-22 ENCOUNTER — Encounter (HOSPITAL_COMMUNITY): Payer: Self-pay | Admitting: Emergency Medicine

## 2017-11-22 ENCOUNTER — Emergency Department (HOSPITAL_COMMUNITY)
Admission: EM | Admit: 2017-11-22 | Discharge: 2017-11-22 | Disposition: A | Discharge status: Home / Self Care | Payer: 59 | Attending: Emergency Medicine | Admitting: Emergency Medicine

## 2017-11-22 DIAGNOSIS — R112 Nausea with vomiting, unspecified: Secondary | ICD-10-CM

## 2017-11-22 DIAGNOSIS — R197 Diarrhea, unspecified: Secondary | ICD-10-CM | POA: Diagnosis not present

## 2017-11-22 DIAGNOSIS — I10 Essential (primary) hypertension: Secondary | ICD-10-CM | POA: Insufficient documentation

## 2017-11-22 DIAGNOSIS — E860 Dehydration: Secondary | ICD-10-CM

## 2017-11-22 DIAGNOSIS — Z79899 Other long term (current) drug therapy: Secondary | ICD-10-CM | POA: Diagnosis not present

## 2017-11-22 LAB — COMPREHENSIVE METABOLIC PANEL
ALK PHOS: 72 U/L (ref 38–126)
ALT: 32 U/L (ref 14–54)
ANION GAP: 10 (ref 5–15)
AST: 22 U/L (ref 15–41)
Albumin: 3.7 g/dL (ref 3.5–5.0)
BUN: 9 mg/dL (ref 6–20)
CALCIUM: 8.8 mg/dL — AB (ref 8.9–10.3)
CO2: 29 mmol/L (ref 22–32)
CREATININE: 0.92 mg/dL (ref 0.44–1.00)
Chloride: 101 mmol/L (ref 101–111)
Glucose, Bld: 129 mg/dL — ABNORMAL HIGH (ref 65–99)
Potassium: 3.5 mmol/L (ref 3.5–5.1)
Sodium: 140 mmol/L (ref 135–145)
TOTAL PROTEIN: 7.6 g/dL (ref 6.5–8.1)
Total Bilirubin: 0.7 mg/dL (ref 0.3–1.2)

## 2017-11-22 LAB — URINALYSIS, ROUTINE W REFLEX MICROSCOPIC
BILIRUBIN URINE: NEGATIVE
GLUCOSE, UA: NEGATIVE mg/dL
KETONES UR: NEGATIVE mg/dL
LEUKOCYTES UA: NEGATIVE
NITRITE: NEGATIVE
Protein, ur: NEGATIVE mg/dL
Specific Gravity, Urine: 1.012 (ref 1.005–1.030)
pH: 6 (ref 5.0–8.0)

## 2017-11-22 LAB — LIPASE, BLOOD: Lipase: 44 U/L (ref 11–51)

## 2017-11-22 LAB — CBC
HCT: 45.6 % (ref 36.0–46.0)
HEMOGLOBIN: 15.6 g/dL — AB (ref 12.0–15.0)
MCH: 30.1 pg (ref 26.0–34.0)
MCHC: 34.2 g/dL (ref 30.0–36.0)
MCV: 88 fL (ref 78.0–100.0)
Platelets: 212 10*3/uL (ref 150–400)
RBC: 5.18 MIL/uL — AB (ref 3.87–5.11)
RDW: 13.4 % (ref 11.5–15.5)
WBC: 7 10*3/uL (ref 4.0–10.5)

## 2017-11-22 MED ORDER — SODIUM CHLORIDE 0.9 % IV BOLUS
1000.0000 mL | Freq: Once | INTRAVENOUS | Status: AC
  Administered 2017-11-22: 1000 mL via INTRAVENOUS

## 2017-11-22 MED ORDER — ONDANSETRON HCL 4 MG/2ML IJ SOLN
4.0000 mg | Freq: Once | INTRAMUSCULAR | Status: AC
  Administered 2017-11-22: 4 mg via INTRAVENOUS
  Filled 2017-11-22: qty 2

## 2017-11-22 MED ORDER — ONDANSETRON HCL 4 MG PO TABS: 4.0000 mg | ORAL_TABLET | Freq: Three times a day (TID) | ORAL | 0 refills | Status: DC | PRN

## 2017-11-22 NOTE — ED Provider Notes (Signed)
[facility] DEPT Provider Note   CSN: [id number] Arrival date & time: 11/22/17  1126     History   Chief Complaint Chief Complaint  Patient presents with  . Nausea    HPI Angela Washington is a 52 y.o. female.  The history is provided by the patient, the spouse and medical records.  Emesis   This is a new problem. The current episode started more than 2 days ago. The problem occurs continuously. The problem has not changed since onset.The emesis has an appearance of stomach contents. There has been no fever. Associated symptoms include diarrhea. Pertinent negatives include no abdominal pain (resolved), no chills, no cough, no fever, no headaches and no URI. Risk factors include ill contacts.  Diarrhea   This is a new problem. The current episode started more than 2 days ago. The problem occurs continuously. The problem has not changed since onset.The stool consistency is described as watery. Associated symptoms include vomiting. Pertinent negatives include no abdominal pain (resolved), no chills, no headaches, no URI and no cough. She has tried nothing for the symptoms.    Past Medical History:  Diagnosis Date  . DVT (deep venous thrombosis) ([place])   . Headache(784.0)   . Hyperlipidemia 2008   diet controlled  . Hypertension   . Peripheral vascular disease ([place]) 2008   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  . PONV (postoperative nausea and vomiting)   . 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 Tachycardia     Patient Active Problem List   Diagnosis Date Noted  . ANA positive 05/19/2017  . Family history of lupus erythematosus 05/19/2017  . History of hypertension 05/19/2017  . History of hypercholesterolemia 05/19/2017  . History of IBS 05/19/2017  . History of migraine 05/19/2017  . SVT (supraventricular tachycardia) ([place]) 05/19/2017  . 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 Primary osteoarthritis of both knees 10/21/2016  . Other psoriasis  10/21/2016  . Pain in joints of both feet 10/21/2016  . S/P hysterectomy 08/22/2013  . RLQ abdominal pain 02/25/2012  . Ovarian torsion 02/25/2012    Past Surgical History:  Procedure Laterality Date  . APPENDECTOMY    . BREAST REDUCTION SURGERY  2000  . [place]   2  . CHOLECYSTECTOMY    . COLON SURGERY     reduction  . LAPAROSCOPY  02/24/2012   Procedure: LAPAROSCOPY OPERATIVE;  Surgeon: Marvene Staff, MD;  Location: [facility];  Service: Gynecology;  Laterality: Right;  with Lyses of Adhesions  . lapor     Ovarian Cyst and appedectomy  . ROBOTIC ASSISTED TOTAL HYSTERECTOMY N/A 08/22/2013   Procedure: ROBOTIC ASSISTED TOTAL HYSTERECTOMY/BILATERAL SALPINGECTOMY;  Surgeon: Marvene Staff, MD;  Location: [facility];  Service: Gynecology;  Laterality: N/A;  . SALPINGOOPHORECTOMY  02/24/2012   Procedure: SALPINGO OOPHERECTOMY;  Surgeon: Marvene Staff, MD;  Location: [facility];  Service: Gynecology;  Laterality: Right;  Right Salpingo-Oophorectomy; Left Salpingectomy  . TONSILLECTOMY    . TUBAL LIGATION       OB History   None      Home Medications    Prior to Admission medications   Medication Sig Start Date End Date Taking? Authorizing Provider  buPROPion (WELLBUTRIN XL) 150 MG 24 hr tablet Take 150 mg by mouth every morning.  07/21/16   [provider]  BYSTOLIC 20 MG TABS Take 1 tablet by mouth daily. 04/01/17   [provider]  cholecalciferol (VITAMIN D) 1000  UNITS tablet Take 2,000 Units by mouth daily. Pt takes 2,000 units daily.    [provider]  ELIQUIS 2.5 MG TABS tablet Take 2.5 mg by mouth 2 (two) times daily.  07/09/16   [provider]  ELIQUIS 5 MG TABS tablet Take 5 mg by mouth 2 (two) times daily. 05/18/17   [provider]  methocarbamol (ROBAXIN) 500 MG tablet Take 2 tablets (1,000 mg total) by mouth 4 (four) times daily as needed for muscle spasms (muscle spasm/pain). Patient not taking:  Reported on 05/20/2017 01/02/17   Francine Graven, DO  metoprolol succinate (TOPROL-XL) 50 MG 24 hr tablet Take 50 mg by mouth at bedtime. Take with or immediately following a meal.    [provider]  oxyCODONE-acetaminophen (PERCOCET/ROXICET) 5-325 MG tablet 1 or 2 tabs PO q6h prn pain Patient not taking: Reported on 05/20/2017 01/02/17   Francine Graven, DO  rizatriptan (MAXALT) 10 MG tablet Take 10 mg by mouth as needed for migraine. Angela repeat in 2 hours if needed for migraines.    [provider]  VOLTAREN 1 % GEL Apply 1 application topically daily as needed. 02/23/17   [provider]    Family History Family History  Problem Relation Age of Onset  . Heart attack Father   . Parkinsonism Mother   . Heart attack Mother   . Thyroid cancer Sister     Social History Social History   Tobacco Use  . Smoking status: Never Smoker  . Smokeless tobacco: Never Used  Substance Use Topics  . Alcohol use: No  . Drug use: No     Allergies   Morphine and related   Review of Systems Review of Systems  Constitutional: Positive for appetite change and fatigue. Negative for chills, diaphoresis, fever and unexpected weight change.  HENT: Negative for congestion.   Respiratory: Negative for cough, chest tightness and shortness of breath.   Cardiovascular: Negative for chest pain and leg swelling.  Gastrointestinal: Positive for diarrhea, nausea and vomiting. Negative for abdominal distention, abdominal pain (resolved), blood in stool and constipation.  Genitourinary: Negative for dysuria, flank pain and frequency.  Musculoskeletal: Negative for back pain, neck pain and neck stiffness.  Skin: Negative for rash and wound.  Neurological: Negative for light-headedness and headaches.  Psychiatric/Behavioral: Negative for agitation.     Physical Exam Updated Vital Signs BP 130/87 (BP Location: Left Arm)   Pulse 79   Temp 98.5 F (36.9 C) (Oral)   Resp 15    Ht 5\' 4"  (1.626 m)   Wt 81.6 kg (180 lb)   LMP 08/10/2013 Comment: ablation  SpO2 100%   BMI 30.90 kg/m   Physical Exam  Constitutional: She is oriented to person, place, and time. She appears well-developed and well-nourished. No distress.  HENT:  Head: Normocephalic.  Nose: Nose normal.  Mouth/Throat: Oropharynx is clear and moist. No oropharyngeal exudate.  Eyes: Pupils are equal, round, and reactive to light. Conjunctivae and EOM are normal.  Cardiovascular: Normal rate and intact distal pulses.  No murmur heard. Pulmonary/Chest: Effort normal and breath sounds normal. No respiratory distress. She has no wheezes. She has no rales. She exhibits no tenderness.  Abdominal: Soft. She exhibits no distension. There is no tenderness.  Musculoskeletal: She exhibits no edema or tenderness.  Neurological: She is alert and oriented to person, place, and time. No sensory deficit. She exhibits normal muscle tone.  Skin: Capillary refill takes less than 2 seconds. No rash noted. She is not  diaphoretic. No erythema.  Psychiatric: She has a normal mood and affect.  Nursing note and vitals reviewed.    ED Treatments / Results  Labs (all labs ordered are listed, but only abnormal results are displayed) Labs Reviewed  COMPREHENSIVE METABOLIC PANEL - Abnormal; Notable for the following components:      Result Value   Glucose, Bld 129 (*)    Calcium 8.8 (*)    All other components within normal limits  CBC - Abnormal; Notable for the following components:   RBC 5.18 (*)    Hemoglobin 15.6 (*)    All other components within normal limits  URINALYSIS, ROUTINE W REFLEX MICROSCOPIC - Abnormal; Notable for the following components:   APPearance HAZY (*)    Hgb urine dipstick SMALL (*)    Bacteria, UA RARE (*)    All other components within normal limits  LIPASE, BLOOD    EKG None  Radiology No results found.  Procedures Procedures (including critical care time)  Medications Ordered  in ED Medications  sodium chloride 0.9 % bolus 1,000 mL (0 mLs Intravenous Stopped 11/22/17 1804)  sodium chloride 0.9 % bolus 1,000 mL (0 mLs Intravenous Stopped 11/22/17 1804)  ondansetron (ZOFRAN) injection 4 mg (4 mg Intravenous Given 11/22/17 1734)     Initial Impression / Assessment and Plan / ED Course  I have reviewed the triage vital signs and the nursing notes.  Pertinent labs & imaging results that were available during my care of the patient were reviewed by me and considered in my medical decision making (see chart for details).     Angela Washington is a 52 y.o. female with a past medical history significant for hypertension, hyperlipidemia, DVT on Eliquis therapy who presents from her PCP office for nausea, vomiting, diarrhea, and concern for dehydration.  Patient reports that 4 days, she has had nearly constant nausea, vomiting, diarrhea.  She reports it is been nonbloody.  She reports she had some abdominal cramping initially but has improved.  She reports that she was feeling very fatigued and dehydrated went to see her PCP today who was unable to get blood work due to dehydration.  Patient was then sent to the ED for lab work reevaluation and rehydration.  Patient says that she has been around multiple sick contacts in her family who have had similar symptoms.  She reports that she is already had her gallbladder out her appendix out.  She denies history of diverticulitis or other problems.  She denies any pelvic symptoms.  She denies any fevers, chills, chest pain, shortness breath, cough, congestion, or URI symptoms.  She currently denies abdominal pain.  She reports that she is sore when she has retching vomiting.  She reports that she vomited for several hours this morning.  On exam, abdomen is nontender.  Lungs are clear and chest is nontender.  Patient has dry mucous membranes on my exam.  Patient has no significant lower extremity edema and had pulses in all extremities.     Suspect patient is dehydrated from her negative fluid balance and decreased oral intake over the last few days.  Patient has not used any nausea medicine at home.    Patient had laboratory testing performed showing normal lipase, reassuring metabolic panel with normal kidney function and liver function.  CBC shows elevated hemoglobin likely due to hemoconcentration.  Urinalysis does not show nitrites or leukocytes, doubt UTI.  Given patient's reassuring lab work, suspect patient is dehydrated in the setting of  viral gastroenteritis which is gone through the family.  Patient will be given nausea medicine and fluids.  Patient will be reassessed after 2 L to see if she is feeling better.  Anticipate conservative management with prescription of nausea medicine at discharge if she is improved.    Patient was reassessed and had improvement in her symptoms.  Patient was able to tolerate eating and drinking.  I suspect patient is dehydrated in the setting of likely viral gastroneuritis.  As patient symptoms have improved with feels that she is safe for discharge home.    Patient will follow-up with PCP in several days and understood return precautions.  Patient discharged in good condition.   Final Clinical Impressions(s) / ED Diagnoses   Final diagnoses:  Dehydration  Nausea vomiting and diarrhea    ED Discharge Orders        Ordered    ondansetron (ZOFRAN) 4 MG tablet  Every 8 hours PRN     11/22/17 2140       Clinical Impression: 1. Dehydration   2. Nausea vomiting and diarrhea     Disposition: Discharge  Condition: Good  I have discussed the results, Dx and Tx plan with the pt(& family if present). He/she/they expressed understanding and agree(s) with the plan. Discharge instructions discussed at great length. Strict return precautions discussed and pt &/or family have verbalized understanding of the instructions. No further questions at time of discharge.    New Prescriptions    ONDANSETRON (ZOFRAN) 4 MG TABLET    Take 1 tablet (4 mg total) by mouth every 8 (eight) hours as needed for nausea or vomiting.    Follow Up: Hayden Rasmussen, MD [place] [id number] [facility] [place] [id number] mc [address] [place] [phone]       Tekia Waterbury, Gwenyth Allegra, MD 11/22/17 2142

## 2017-11-22 NOTE — Discharge Instructions (Signed)
Your work-up today  was overall reassuring.  We suspect you are dehydrated in the setting of the likely viral gastroenteritis causing nausea, vomiting, diarrhea.  Please use the nausea medicine to help with your symptoms and stay hydrated.  Please observe good handwashing.  If any symptoms change or worsen, please return to the nearest emergency department.  Please follow-up with your primary doctor in several days for reassessment.

## 2017-11-22 NOTE — ED Triage Notes (Signed)
PT REPORTS THAT SHE HAD N/V/D ON Saturday THAT STOPPED ON Sunday. REPORTS STILL NAUSEATED AT THOUGHT OF FOOD. FEELS WEAK AND VERY TIRED.  WAS AT PCP WHO TRIED TO GET BLOOD AND LINE BUT UNABLE TO, SO SENT TO ED FOR FURTHER WORK UP.

## 2018-05-08 NOTE — Progress Notes (Signed)
Office Visit Note  Patient: Angela Washington             Date of Birth: 1965/09/13           MRN: [id number]             PCP: Hayden Rasmussen, MD Referring: Hayden Rasmussen, MD Visit Date: 05/22/2018 Occupation: @GUAROCC @  Subjective:  Left trochanteric bursitis   History of Present Illness: Angela Washington is a 52 y.o. female with history of osteoarthritis and positive ANA. She reports that she was diagnosed with biopsy proven psoriasis since her last visit.  She denies any active patches right now.  She will be following up with [facility] in several months.  She denies any recent rashes. She denies sun sensitivity.  She denies any sores in her mouth or nose.  She denies any symptoms of Raynaud's or digital ulcerations.  She denies any sicca symptoms or swollen lymph nodes. She denies any fatigue or low grade fevers. She has hair thinning but attributes it to menopause. She reports she has not had any joint pain in 4 months.  She denies any joint swelling.  She has joint stiffness for about 15-30 minutes every morning. She has left trochanteric bursitis.  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  She has been performing exercises at home.   Activities of Daily Living:  Patient reports morning stiffness for 15-30  minutes.   Patient Denies nocturnal pain.  Difficulty dressing/grooming: Denies Difficulty climbing stairs: Denies Difficulty getting out of chair: Denies Difficulty using hands for taps, buttons, cutlery, and/or writing: Denies  Review of Systems  Constitutional: Negative for fatigue.  HENT: Negative for mouth sores, mouth dryness and nose dryness.   Eyes: Negative for pain, visual disturbance and dryness.  Respiratory: Negative for cough, hemoptysis, shortness of breath and difficulty breathing.   Cardiovascular: Negative for chest pain, palpitations, hypertension and swelling in legs/feet.  Gastrointestinal: Negative for blood in stool,  constipation and diarrhea.  Endocrine: Negative for increased urination.  Genitourinary: Negative for painful urination.  Musculoskeletal: Negative for arthralgias, joint pain, joint swelling, myalgias, muscle weakness, morning stiffness, muscle tenderness and myalgias.  Skin: Negative for color change, pallor, rash, hair loss, nodules/bumps, skin tightness, ulcers and sensitivity to sunlight.  Allergic/Immunologic: Negative for susceptible to infections.  Neurological: Negative for dizziness, numbness, headaches and weakness.  Hematological: Negative for swollen glands.  Psychiatric/Behavioral: Negative for depressed mood and sleep disturbance. The patient is not nervous/anxious.     PMFS History:  Patient Active Problem List   Diagnosis Date Noted  . ANA positive 05/19/2017  . Family history of lupus erythematosus 05/19/2017  . History of hypertension 05/19/2017  . History of hypercholesterolemia 05/19/2017  . History of IBS 05/19/2017  . History of migraine 05/19/2017  . SVT (supraventricular tachycardia) ([place]) 05/19/2017  . 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 Primary osteoarthritis of both knees 10/21/2016  . Other psoriasis 10/21/2016  . Pain in joints of both feet 10/21/2016  . S/P hysterectomy 08/22/2013  . RLQ abdominal pain 02/25/2012  . Ovarian torsion 02/25/2012    Past Medical History:  Diagnosis Date  . DVT (deep venous thrombosis) ([place])   . Headache(784.0)   . Hyperlipidemia 2008   diet controlled  . Hypertension   . Peripheral vascular disease ([place]) 2008   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  . PONV (postoperative nausea and vomiting)   . 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  Tachycardia     Family History  Problem Relation Age of Onset  . Heart attack Father   . Parkinsonism Mother   . Heart attack Mother   . Thyroid cancer Sister    Past Surgical History:  Procedure Laterality Date  . APPENDECTOMY    . BREAST REDUCTION SURGERY  2000  . [place]   2  . CHOLECYSTECTOMY    . COLON SURGERY     reduction  . LAPAROSCOPY  02/24/2012   Procedure: LAPAROSCOPY OPERATIVE;  Surgeon: Marvene Staff, MD;  Location: [facility];  Service: Gynecology;  Laterality: Right;  with Lyses of Adhesions  . lapor     Ovarian Cyst and appedectomy  . ROBOTIC ASSISTED TOTAL HYSTERECTOMY N/A 08/22/2013   Procedure: ROBOTIC ASSISTED TOTAL HYSTERECTOMY/BILATERAL SALPINGECTOMY;  Surgeon: Marvene Staff, MD;  Location: [facility];  Service: Gynecology;  Laterality: N/A;  . SALPINGOOPHORECTOMY  02/24/2012   Procedure: SALPINGO OOPHERECTOMY;  Surgeon: Marvene Staff, MD;  Location: [facility];  Service: Gynecology;  Laterality: Right;  Right Salpingo-Oophorectomy; Left Salpingectomy  . TONSILLECTOMY    . TUBAL LIGATION     Social History   Social History Narrative  . Not on file    Objective: Vital Signs: BP 131/83 (BP Location: Left Arm, Patient Position: Sitting, Cuff Size: Normal)   Pulse 66   Resp 14   Ht 5\' 4"  (1.626 m)   Wt 185 lb 12.8 oz (84.3 kg)   LMP 08/10/2013 Comment: ablation  BMI 31.89 kg/m    Physical Exam  Constitutional: She is oriented to person, place, and time. She appears well-developed and well-nourished.  HENT:  Head: Normocephalic and atraumatic.  Eyes: Conjunctivae and EOM are normal.  Neck: Normal range of motion.  Cardiovascular: Normal rate, regular rhythm, normal heart sounds and intact distal pulses.  Pulmonary/Chest: Effort normal and breath sounds normal.  Abdominal: Soft. Bowel sounds are normal.  Lymphadenopathy:    She has no cervical adenopathy.  Neurological: She is alert and oriented to person, place, and time.  Skin: Skin is warm and dry. Capillary refill takes less than 2 seconds.  Psychiatric: She has a normal mood and affect. Her behavior is normal.  Nursing note and vitals reviewed.    Musculoskeletal Exam: C-spine, thoracic spine, and lumbar spine good ROM.  No midline spinal  tenderness.  No SI joint tenderness.  Shoulder joints, elbow joints, wrist joints, MCPs, PIPs, and DIPs good ROM with no synovitis.  Complete fist formation bilaterally.  Hip joints, knee joints, ankle joints, MTPs, PIPs ,and DIPs good ROM with no synovitis.  No warmth or effusion of knee joints.  No ankle joint swelling or tenderness.  No achilles tendonitis or plantar fasciitis .   CDAI Exam: CDAI Score: Not documented Patient Global Assessment: Not documented; Provider Global Assessment: Not documented Swollen: Not documented; Tender: Not documented Joint Exam   Not documented   There is currently no information documented on the homunculus. Go to the Rheumatology activity and complete the homunculus joint exam.  Investigation: No additional findings.  Imaging: No results found.  Recent Labs: Lab Results  Component Value Date   WBC 7.0 11/22/2017   HGB 15.6 (H) 11/22/2017   PLT 212 11/22/2017   NA 140 11/22/2017   K 3.5 11/22/2017   CL 101 11/22/2017   CO2 29 11/22/2017   GLUCOSE 129 (H) 11/22/2017   BUN 9 11/22/2017   CREATININE 0.92 11/22/2017   BILITOT 0.7 11/22/2017  ALKPHOS 72 11/22/2017   AST 22 11/22/2017   ALT 32 11/22/2017   PROT 7.6 11/22/2017   ALBUMIN 3.7 11/22/2017   CALCIUM 8.8 (L) 11/22/2017   GFRAA >60 11/22/2017    Speciality Comments: No specialty comments available.  Procedures:  No procedures performed Allergies: Morphine and related   Assessment / Plan:     Visit Diagnoses: ANA positive - 1:320 NS - She has no clinical features of autoimmune disease at this time.  She has no synovitis on exam.  She has not had joint pain in over 4 months.  Since her last visit she was diagnosed with biopsy proven psoriasis, but she has no active lesions or nail pitting at this time. No evidence of a malar rash.  She denies sun sensitivity.  She denies fatigue or low grade fevers recently. No oral or nasal ulcerations.  No symptoms of Raynaud's or digital  ulcerations.  She denies sicca symptoms.  No parotid swelling noted.  She has no palpable cervical lymphadenopathy.  We will check autoimmune labs today.  She was advised to notify us if she develops any new or worsening symptoms.  She will follow up in the office in 1 year.  Plan: Urinalysis, Routine w reflex microscopic, COMPLETE METABOLIC PANEL WITH GFR, CBC with Differential/Platelet, Anti-DNA antibody, double-stranded, C3 and C4, Sedimentation rate, ANA  Primary osteoarthritis of both knees - chondromalacia patella: No warmth or effusion.  Good ROM with no discomfort.   Psoriasis - No active patches or nail pitting noted.  She will be following up at [facility] in several months.  She was advised to notify us if she develops increased joint pain or joint swelling.   Trochanteric bursitis, left hip:  She has tenderness of the left trochanteric bursa on exam.  She previously went to physical therapy and has seen a chiropractor.  She has been performing stretching exercises.   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 She is on Eliquis.  Family history of lupus erythematosus - maternal aunt    Orders: Orders Placed This Encounter  Procedures  . Urinalysis, Routine w reflex microscopic  . COMPLETE METABOLIC PANEL WITH GFR  . CBC with Differential/Platelet  . Anti-DNA antibody, double-stranded  . C3 and C4  . Sedimentation rate  . ANA   No orders of the defined types were placed in this encounter.     Follow-Up Instructions: Return in about 1 year (around 05/23/2019) for Positive ANA , Osteoarthritis.   Ofilia Neas, PA-C   I examined and evaluated the patient with Hazel Sams  PA.  Patient had no active synovitis and no other clinical features of autoimmune disease.  She had few patches of psoriasis in the past which were diagnosed by dermatologist.  She had no active lesions today.  We will obtain labs as mentioned above.  The plan of care was discussed as noted above.  Bo Merino, MD  Note - This record has been created using Editor, commissioning.  Chart creation errors have been sought, but may not always  have been located. Such creation errors do not reflect on  the standard of medical care.

## 2018-05-22 ENCOUNTER — Ambulatory Visit: Payer: 59 | Admitting: Rheumatology

## 2018-05-22 ENCOUNTER — Encounter (INDEPENDENT_AMBULATORY_CARE_PROVIDER_SITE_OTHER): Payer: Self-pay

## 2018-05-22 ENCOUNTER — Encounter: Payer: Self-pay | Admitting: Rheumatology

## 2018-05-22 VITALS — BP 131/83 | HR 66 | Resp 14 | Ht 64.0 in | Wt 185.8 lb

## 2018-05-22 DIAGNOSIS — R768 Other specified abnormal immunological findings in serum: Secondary | ICD-10-CM | POA: Diagnosis not present

## 2018-05-22 DIAGNOSIS — D6852 Prothrombin gene mutation: Secondary | ICD-10-CM

## 2018-05-22 DIAGNOSIS — L409 Psoriasis, unspecified: Secondary | ICD-10-CM | POA: Diagnosis not present

## 2018-05-22 DIAGNOSIS — Z8669 Personal history of other diseases of the nervous system and sense organs: Secondary | ICD-10-CM

## 2018-05-22 DIAGNOSIS — Z87898 Personal history of other specified conditions: Secondary | ICD-10-CM

## 2018-05-22 DIAGNOSIS — I471 Supraventricular tachycardia, unspecified: Secondary | ICD-10-CM

## 2018-05-22 DIAGNOSIS — M170 Bilateral primary osteoarthritis of knee: Secondary | ICD-10-CM

## 2018-05-22 DIAGNOSIS — R7689 Other specified abnormal immunological findings in serum: Secondary | ICD-10-CM

## 2018-05-22 DIAGNOSIS — Z840 Family history of diseases of the skin and subcutaneous tissue: Secondary | ICD-10-CM

## 2018-05-22 DIAGNOSIS — Z9049 Acquired absence of other specified parts of digestive tract: Secondary | ICD-10-CM

## 2018-05-22 DIAGNOSIS — Z8679 Personal history of other diseases of the circulatory system: Secondary | ICD-10-CM

## 2018-05-22 DIAGNOSIS — Z8719 Personal history of other diseases of the digestive system: Secondary | ICD-10-CM

## 2018-05-22 DIAGNOSIS — Z8639 Personal history of other endocrine, nutritional and metabolic disease: Secondary | ICD-10-CM

## 2018-05-22 DIAGNOSIS — M7062 Trochanteric bursitis, left hip: Secondary | ICD-10-CM | POA: Diagnosis not present

## 2018-05-24 LAB — COMPLETE METABOLIC PANEL WITH GFR
AG RATIO: 1.5 (calc) (ref 1.0–2.5)
ALBUMIN MSPROF: 4.3 g/dL (ref 3.6–5.1)
ALT: 41 U/L — ABNORMAL HIGH (ref 6–29)
AST: 24 U/L (ref 10–35)
Alkaline phosphatase (APISO): 82 U/L (ref 33–130)
BILIRUBIN TOTAL: 0.5 mg/dL (ref 0.2–1.2)
BUN: 23 mg/dL (ref 7–25)
CHLORIDE: 102 mmol/L (ref 98–110)
CO2: 30 mmol/L (ref 20–32)
Calcium: 9.6 mg/dL (ref 8.6–10.4)
Creat: 1.01 mg/dL (ref 0.50–1.05)
GFR, EST AFRICAN AMERICAN: 75 mL/min/{1.73_m2} (ref >=60)
GFR, EST NON AFRICAN AMERICAN: 64 mL/min/{1.73_m2} (ref >=60)
GLOBULIN: 2.9 g/dL (ref 1.9–3.7)
Glucose, Bld: 116 mg/dL — ABNORMAL HIGH (ref 65–99)
POTASSIUM: 4.3 mmol/L (ref 3.5–5.3)
SODIUM: 139 mmol/L (ref 135–146)
TOTAL PROTEIN: 7.2 g/dL (ref 6.1–8.1)

## 2018-05-24 LAB — C3 AND C4
C3 COMPLEMENT: 166 mg/dL (ref 83–193)
C4 COMPLEMENT: 33 mg/dL (ref 15–57)

## 2018-05-24 LAB — CBC WITH DIFFERENTIAL/PLATELET
BASOS ABS: 58 {cells}/uL (ref 0–200)
Basophils Relative: 0.8 %
EOS ABS: 190 {cells}/uL (ref 15–500)
EOS PCT: 2.6 %
HCT: 45.7 % — ABNORMAL HIGH (ref 35.0–45.0)
Hemoglobin: 15.8 g/dL — ABNORMAL HIGH (ref 11.7–15.5)
Lymphs Abs: 2738 cells/uL (ref 850–3900)
MCH: 30.2 pg (ref 27.0–33.0)
MCHC: 34.6 g/dL (ref 32.0–36.0)
MCV: 87.4 fL (ref 80.0–100.0)
MONOS PCT: 7.8 %
MPV: 10.9 fL (ref 7.5–12.5)
NEUTROS PCT: 51.3 %
Neutro Abs: 3745 cells/uL (ref 1500–7800)
Platelets: 277 10*3/uL (ref 140–400)
RBC: 5.23 10*6/uL — ABNORMAL HIGH (ref 3.80–5.10)
RDW: 13.2 % (ref 11.0–15.0)
Total Lymphocyte: 37.5 %
WBC mixed population: 569 cells/uL (ref 200–950)
WBC: 7.3 10*3/uL (ref 3.8–10.8)

## 2018-05-24 LAB — URINALYSIS, ROUTINE W REFLEX MICROSCOPIC
Bilirubin Urine: NEGATIVE
Glucose, UA: NEGATIVE
HGB URINE DIPSTICK: NEGATIVE
KETONES UR: NEGATIVE
LEUKOCYTES UA: NEGATIVE
NITRITE: NEGATIVE
Protein, ur: NEGATIVE
Specific Gravity, Urine: 1.023 (ref 1.001–1.03)
pH: <=5 (ref 5.0–8.0)

## 2018-05-24 LAB — ANTI-NUCLEAR AB-TITER (ANA TITER): ANA Titer 1: 1:160 {titer} — ABNORMAL HIGH

## 2018-05-24 LAB — SEDIMENTATION RATE: SED RATE: 6 mm/h (ref 0–30)

## 2018-05-24 LAB — ANTI-DNA ANTIBODY, DOUBLE-STRANDED: ds DNA Ab: <1 IU/mL

## 2018-05-24 LAB — ANA: ANA: POSITIVE — AB

## 2018-05-24 NOTE — Progress Notes (Signed)
UA normal.  CBC stable. Glucose is 116.  ALT is 41. Please avoid alcohol and tylenol at this time.  Please forward to PCP.  ANA titer 1:160 which lower than previous titers.  DsDNA negative and complements WNL. Sed rate WNL.

## 2018-06-05 ENCOUNTER — Encounter: Payer: Self-pay | Admitting: *Deleted

## 2019-10-17 ENCOUNTER — Other Ambulatory Visit: Payer: Self-pay | Admitting: Radiology

## 2020-08-13 DIAGNOSIS — I10 Essential (primary) hypertension: Secondary | ICD-10-CM | POA: Diagnosis not present

## 2020-11-18 DIAGNOSIS — R928 Other abnormal and inconclusive findings on diagnostic imaging of breast: Secondary | ICD-10-CM | POA: Diagnosis not present

## 2021-02-04 DIAGNOSIS — M722 Plantar fascial fibromatosis: Secondary | ICD-10-CM | POA: Diagnosis not present

## 2021-02-10 ENCOUNTER — Other Ambulatory Visit (HOSPITAL_COMMUNITY): Payer: Self-pay | Admitting: Gastroenterology

## 2021-02-10 DIAGNOSIS — R110 Nausea: Secondary | ICD-10-CM | POA: Diagnosis not present

## 2021-02-10 DIAGNOSIS — R1033 Periumbilical pain: Secondary | ICD-10-CM | POA: Diagnosis not present

## 2021-02-10 DIAGNOSIS — Z86010 Personal history of colonic polyps: Secondary | ICD-10-CM | POA: Diagnosis not present

## 2021-02-12 ENCOUNTER — Encounter (HOSPITAL_BASED_OUTPATIENT_CLINIC_OR_DEPARTMENT_OTHER): Payer: Self-pay

## 2021-02-12 ENCOUNTER — Other Ambulatory Visit: Payer: Self-pay

## 2021-02-12 ENCOUNTER — Ambulatory Visit (HOSPITAL_BASED_OUTPATIENT_CLINIC_OR_DEPARTMENT_OTHER)
Admission: RE | Admit: 2021-02-12 | Discharge: 2021-02-12 | Disposition: A | Discharge status: Home / Self Care | Payer: BC Managed Care – PPO | Source: Ambulatory Visit | Attending: Gastroenterology | Admitting: Gastroenterology

## 2021-02-12 DIAGNOSIS — R1033 Periumbilical pain: Secondary | ICD-10-CM | POA: Diagnosis not present

## 2021-02-12 DIAGNOSIS — K760 Fatty (change of) liver, not elsewhere classified: Secondary | ICD-10-CM | POA: Diagnosis not present

## 2021-02-12 MED ORDER — IOHEXOL 350 MG/ML SOLN
75.0000 mL | Freq: Once | INTRAVENOUS | Status: AC | PRN
  Administered 2021-02-12: 75 mL via INTRAVENOUS

## 2021-02-23 DIAGNOSIS — M722 Plantar fascial fibromatosis: Secondary | ICD-10-CM | POA: Diagnosis not present

## 2021-02-27 DIAGNOSIS — M722 Plantar fascial fibromatosis: Secondary | ICD-10-CM | POA: Diagnosis not present

## 2021-03-05 DIAGNOSIS — K760 Fatty (change of) liver, not elsewhere classified: Secondary | ICD-10-CM | POA: Diagnosis not present

## 2021-03-05 DIAGNOSIS — E669 Obesity, unspecified: Secondary | ICD-10-CM | POA: Diagnosis not present

## 2021-03-05 DIAGNOSIS — R1011 Right upper quadrant pain: Secondary | ICD-10-CM | POA: Diagnosis not present

## 2021-03-05 DIAGNOSIS — Z86010 Personal history of colonic polyps: Secondary | ICD-10-CM | POA: Diagnosis not present

## 2021-03-05 DIAGNOSIS — R748 Abnormal levels of other serum enzymes: Secondary | ICD-10-CM | POA: Diagnosis not present

## 2021-03-06 DIAGNOSIS — M722 Plantar fascial fibromatosis: Secondary | ICD-10-CM | POA: Diagnosis not present

## 2021-03-13 DIAGNOSIS — M722 Plantar fascial fibromatosis: Secondary | ICD-10-CM | POA: Diagnosis not present

## 2021-04-03 DIAGNOSIS — M67872 Other specified disorders of synovium, left ankle and foot: Secondary | ICD-10-CM | POA: Diagnosis not present

## 2021-04-03 DIAGNOSIS — M67871 Other specified disorders of synovium, right ankle and foot: Secondary | ICD-10-CM | POA: Diagnosis not present

## 2021-04-09 DIAGNOSIS — M67871 Other specified disorders of synovium, right ankle and foot: Secondary | ICD-10-CM | POA: Diagnosis not present

## 2021-04-09 DIAGNOSIS — M67872 Other specified disorders of synovium, left ankle and foot: Secondary | ICD-10-CM | POA: Diagnosis not present

## 2021-05-19 DIAGNOSIS — N281 Cyst of kidney, acquired: Secondary | ICD-10-CM | POA: Diagnosis not present

## 2021-06-02 ENCOUNTER — Other Ambulatory Visit: Payer: Self-pay | Admitting: Urology

## 2021-06-30 DIAGNOSIS — N281 Cyst of kidney, acquired: Secondary | ICD-10-CM | POA: Diagnosis not present

## 2021-06-30 DIAGNOSIS — R8271 Bacteriuria: Secondary | ICD-10-CM | POA: Diagnosis not present

## 2021-07-06 NOTE — Patient Instructions (Addendum)
DUE TO COVID-19 ONLY ONE VISITOR IS ALLOWED TO COME WITH YOU AND STAY IN THE WAITING ROOM ONLY DURING PRE OP AND PROCEDURE DAY OF SURGERY IF YOU ARE GOING HOME AFTER SURGERY. IF YOU ARE SPENDING THE NIGHT 2 PEOPLE MAY VISIT WITH YOU IN YOUR PRIVATE ROOM AFTER SURGERY UNTIL VISITING  HOURS ARE OVER AT 800 PM AND 1  VISITOR  MAY  SPEND THE NIGHT.   YOU NEED TO HAVE A COVID 19 TEST ON__12/16__THIS TEST MUST BE DONE BEFORE SURGERY,  COVID TESTING SITE  IS LOCATED AT [place], [place]. REMAIN IN YOUR CAR THIS IS A DRIVE UP TEST. AFTER YOUR COVID TEST PLEASE WEAR A MASK OUT IN PUBLIC AND SOCIAL DISTANCE AND [place] YOUR HANDS FREQUENTLY, ALSO ASK ALL YOUR CLOSE CONTACT PERSONS TO WEAR A MASK AND SOCIAL DISTANCE AND [place] THEIR HANDS FREQUENTLY ALSO.               Angela Washington     Your procedure is scheduled on: 07/14/21   Report to [facility] Main  Entrance   Report to admitting at 9:45 AM     Call this number if you have problems the morning of surgery [phone]    Remember: Do not eat food  :After Midnight the night before your surgery,   You may have clear liquids from midnight until ---.    BRUSH YOUR TEETH MORNING OF SURGERY AND RINSE YOUR MOUTH OUT, NO CHEWING GUM CANDY OR MINTS.     Take these medicines the morning of surgery with A SIP OF WATER: Bystolic  Stop taking __Eliquis________on __12/19________as instructed by _Dr. White____________.     How to Manage Your Diabetes Before and After Surgery  Why is it important to control my blood sugar before and after surgery? Improving blood sugar levels before and after surgery helps healing and can limit problems. A way of improving blood sugar control is eating a healthy diet by:  Eating less sugar and carbohydrates  Increasing activity/exercise  Talking with your doctor about reaching your blood sugar goals High blood sugars (greater than 180 mg/dL) can raise your risk of infections and slow your  recovery, so you will need to focus on controlling your diabetes during the weeks before surgery. Make sure that the doctor who takes care of your diabetes knows about your planned surgery including the date and location.  How do I manage my blood sugar before surgery? Check your blood sugar at least 4 times a day, starting 2 days before surgery, to make sure that the level is not too high or low. Check your blood sugar the morning of your surgery when you wake up and every 2 hours until you get to the Short Stay unit. If your blood sugar is less than 70 mg/dL, you will need to treat for low blood sugar: Do not take insulin. Treat a low blood sugar (less than 70 mg/dL) with  cup of clear juice (cranberry or apple), 4 glucose tablets, OR glucose gel. Recheck blood sugar in 15 minutes after treatment (to make sure it is greater than 70 mg/dL). If your blood sugar is not greater than 70 mg/dL on recheck, call [phone] for further instructions. Report your blood sugar to the short stay nurse when you get to Short Stay.  If you are admitted to the hospital after surgery: Your blood sugar will be checked by the staff and you will probably be given insulin after surgery (instead of  oral diabetes medicines) to make sure you have good blood sugar levels. The goal for blood sugar control after surgery is 80-180 mg/dL.   WHAT DO I DO ABOUT MY DIABETES MEDICATION?  Hold your [place] the day before surgery.  Do not take oral diabetes medicines (pills) the morning of surgery.                                   You may not have any metal on your body including hair pins and              piercings  Do not wear jewelry, make-up, lotions, powders or perfumes, deodorant             Do not wear nail polish on your fingernails.  Do not shave  48 hours prior to surgery.               Do not bring valuables to the hospital. [place].  Contacts, dentures  or bridgework may not be worn into surgery.  Leave suitcase in the car. After surgery it may be brought to your room.                [place] - Preparing for Surgery Before surgery, you can play an important role.  Because skin is not sterile, your skin needs to be as free of germs as possible.  You can reduce the number of germs on your skin by washing with CHG (chlorahexidine gluconate) soap before surgery.  CHG is an antiseptic cleaner which kills germs and bonds with the skin to continue killing germs even after washing. Please DO NOT use if you have an allergy to CHG or antibacterial soaps.  If your skin becomes reddened/irritated stop using the CHG and inform your nurse when you arrive at Short Stay. Do not shave (including legs and underarms) for at least 48 hours prior to the first CHG shower.  Please follow these instructions carefully:  1.  Shower with CHG Soap the night before surgery and the  morning of Surgery.  2.  If you choose to wash your hair, wash your hair first as usual with your  normal  shampoo.  3.  After you shampoo, rinse your hair and body thoroughly to remove the  shampoo.                            4.  Use CHG as you would any other liquid soap.  You can apply chg directly  to the skin and wash                       Gently with a scrungie or clean washcloth.  5.  Apply the CHG Soap to your body ONLY FROM THE NECK DOWN.   Do not use on face/ open                           Wound or open sores. Avoid contact with eyes, ears mouth and genitals (private parts).                       Wash face,  Genitals (private parts) with your normal soap.  6.  Wash thoroughly, paying special attention to the area where your surgery  will be performed.  7.  Thoroughly rinse your body with warm water from the neck down.  8.  DO NOT shower/wash with your normal soap after using and rinsing off  the CHG Soap.                9.  Pat yourself dry with a clean towel.             10.  Wear clean pajamas.            11.  Place clean sheets on your bed the night of your first shower and do not  sleep with pets. Day of Surgery : Do not apply any lotions/deodorants the morning of surgery.  Please wear clean clothes to the hospital/surgery center.  FAILURE TO FOLLOW THESE INSTRUCTIONS MAY RESULT IN THE CANCELLATION OF YOUR SURGERY PATIENT SIGNATURE_________________________________  NURSE SIGNATURE__________________________________  ________________________________________________________________________

## 2021-07-07 ENCOUNTER — Other Ambulatory Visit: Payer: Self-pay

## 2021-07-07 ENCOUNTER — Encounter (HOSPITAL_COMMUNITY)
Admission: RE | Admit: 2021-07-07 | Discharge: 2021-07-07 | Disposition: A | Discharge status: Home / Self Care | Payer: BC Managed Care – PPO | Source: Ambulatory Visit | Attending: Urology | Admitting: Urology

## 2021-07-07 ENCOUNTER — Encounter (HOSPITAL_COMMUNITY): Payer: Self-pay

## 2021-07-07 VITALS — BP 133/74 | HR 58 | Temp 97.8°F | Resp 18 | Ht 64.0 in | Wt 174.0 lb

## 2021-07-07 DIAGNOSIS — E119 Type 2 diabetes mellitus without complications: Secondary | ICD-10-CM | POA: Diagnosis not present

## 2021-07-07 DIAGNOSIS — Z01818 Encounter for other preprocedural examination: Secondary | ICD-10-CM

## 2021-07-07 LAB — HEMOGLOBIN A1C
Hgb A1c MFr Bld: 6 % — ABNORMAL HIGH (ref 4.8–5.6)
Mean Plasma Glucose: 125.5 mg/dL

## 2021-07-07 LAB — CBC
HCT: 44.7 % (ref 36.0–46.0)
Hemoglobin: 15.2 g/dL — ABNORMAL HIGH (ref 12.0–15.0)
MCH: 29.2 pg (ref 26.0–34.0)
MCHC: 34 g/dL (ref 30.0–36.0)
MCV: 86 fL (ref 80.0–100.0)
Platelets: 265 10*3/uL (ref 150–400)
RBC: 5.2 MIL/uL — ABNORMAL HIGH (ref 3.87–5.11)
RDW: 12.7 % (ref 11.5–15.5)
WBC: 7.2 10*3/uL (ref 4.0–10.5)
nRBC: 0 % (ref 0.0–0.2)

## 2021-07-07 LAB — BASIC METABOLIC PANEL
Anion gap: 9 (ref 5–15)
BUN: 17 mg/dL (ref 6–20)
CO2: 30 mmol/L (ref 22–32)
Calcium: 9.5 mg/dL (ref 8.9–10.3)
Chloride: 100 mmol/L (ref 98–111)
Creatinine, Ser: 0.79 mg/dL (ref 0.44–1.00)
GFR, Estimated: >60 mL/min (ref >60)
Glucose, Bld: 124 mg/dL — ABNORMAL HIGH (ref 70–99)
Potassium: 4.1 mmol/L (ref 3.5–5.1)
Sodium: 139 mmol/L (ref 135–145)

## 2021-07-07 LAB — PROTIME-INR
INR: 1.2 (ref 0.8–1.2)
Prothrombin Time: 15.5 seconds — ABNORMAL HIGH (ref 11.4–15.2)

## 2021-07-07 LAB — GLUCOSE, CAPILLARY: Glucose-Capillary: 131 mg/dL — ABNORMAL HIGH (ref 70–99)

## 2021-07-07 NOTE — Progress Notes (Signed)
COVID 07/10/21  PCP - Dr. Jacinto Reap. [place] Cardiologist - none  Chest x-ray - no EKG - 07/07/21-chart Stress Test - no ECHO - no Cardiac Cath - no Pacemaker/ICD device last checked:NA  Sleep Study - no CPAP -   Fasting Blood Sugar - 90-100 Checks Blood Sugar _QD____ times a day  Blood Thinner Instructions:Eliquis/ Dr. Shearon Stalls for Thrombophilia Aspirin Instructions:Stop 1 day prior to DOS/ Dr. Dema Severin Last Dose:07/13/21  Anesthesia review: yes  Patient denies shortness of breath, fever, cough and chest pain at PAT appointment Pt has no SOB with activities.  Patient verbalized understanding of instructions that were given to them at the PAT appointment. Patient was also instructed that they will need to review over the PAT instructions again at home before surgery. yes

## 2021-07-10 ENCOUNTER — Other Ambulatory Visit: Payer: Self-pay | Admitting: Urology

## 2021-07-10 LAB — SARS CORONAVIRUS 2 (TAT 6-24 HRS): SARS Coronavirus 2: NEGATIVE

## 2021-07-14 ENCOUNTER — Ambulatory Visit (HOSPITAL_COMMUNITY): Payer: BC Managed Care – PPO | Admitting: Anesthesiology

## 2021-07-14 ENCOUNTER — Other Ambulatory Visit: Payer: Self-pay

## 2021-07-14 ENCOUNTER — Ambulatory Visit (HOSPITAL_COMMUNITY): Payer: BC Managed Care – PPO | Admitting: Physician Assistant

## 2021-07-14 ENCOUNTER — Encounter (HOSPITAL_COMMUNITY)
Admission: RE | Disposition: A | Discharge status: Home / Self Care | Payer: Self-pay | Source: Ambulatory Visit | Attending: Urology

## 2021-07-14 ENCOUNTER — Observation Stay (HOSPITAL_COMMUNITY)
Admission: RE | Admit: 2021-07-14 | Discharge: 2021-07-15 | Disposition: A | Discharge status: Home / Self Care | Payer: BC Managed Care – PPO | Source: Ambulatory Visit | Attending: Urology | Admitting: Urology

## 2021-07-14 ENCOUNTER — Encounter (HOSPITAL_COMMUNITY): Payer: Self-pay | Admitting: Urology

## 2021-07-14 DIAGNOSIS — Z7984 Long term (current) use of oral hypoglycemic drugs: Secondary | ICD-10-CM | POA: Diagnosis not present

## 2021-07-14 DIAGNOSIS — I10 Essential (primary) hypertension: Secondary | ICD-10-CM | POA: Insufficient documentation

## 2021-07-14 DIAGNOSIS — Z7901 Long term (current) use of anticoagulants: Secondary | ICD-10-CM | POA: Diagnosis not present

## 2021-07-14 DIAGNOSIS — N281 Cyst of kidney, acquired: Secondary | ICD-10-CM | POA: Diagnosis not present

## 2021-07-14 DIAGNOSIS — E119 Type 2 diabetes mellitus without complications: Secondary | ICD-10-CM | POA: Diagnosis not present

## 2021-07-14 DIAGNOSIS — Z01818 Encounter for other preprocedural examination: Secondary | ICD-10-CM

## 2021-07-14 DIAGNOSIS — Z79899 Other long term (current) drug therapy: Secondary | ICD-10-CM | POA: Insufficient documentation

## 2021-07-14 LAB — HEMOGLOBIN AND HEMATOCRIT, BLOOD
HCT: 44.7 % (ref 36.0–46.0)
Hemoglobin: 15.2 g/dL — ABNORMAL HIGH (ref 12.0–15.0)

## 2021-07-14 LAB — GLUCOSE, CAPILLARY
Glucose-Capillary: 105 mg/dL — ABNORMAL HIGH (ref 70–99)
Glucose-Capillary: 183 mg/dL — ABNORMAL HIGH (ref 70–99)

## 2021-07-14 LAB — TYPE AND SCREEN
ABO/RH(D): A POS
Antibody Screen: NEGATIVE

## 2021-07-14 SURGERY — DECORTICATION, CYST, KIDNEY, ROBOT-ASSISTED
Anesthesia: General | Site: Abdomen | Laterality: Right

## 2021-07-14 MED ORDER — FENTANYL CITRATE PF 50 MCG/ML IJ SOSY
PREFILLED_SYRINGE | INTRAMUSCULAR | Status: AC
  Filled 2021-07-14: qty 1

## 2021-07-14 MED ORDER — LIDOCAINE 2% (20 MG/ML) 5 ML SYRINGE
INTRAMUSCULAR | Status: AC
  Filled 2021-07-14: qty 5

## 2021-07-14 MED ORDER — LIDOCAINE 2% (20 MG/ML) 5 ML SYRINGE
INTRAMUSCULAR | Status: AC
  Filled 2021-07-14: qty 10

## 2021-07-14 MED ORDER — LIDOCAINE 2% (20 MG/ML) 5 ML SYRINGE
INTRAMUSCULAR | Status: DC | PRN
  Administered 2021-07-14: 12:00:00 1.5 mg/kg/h via INTRAVENOUS

## 2021-07-14 MED ORDER — DEXAMETHASONE SODIUM PHOSPHATE 10 MG/ML IJ SOLN
INTRAMUSCULAR | Status: DC | PRN
  Administered 2021-07-14: 10 mg via INTRAVENOUS

## 2021-07-14 MED ORDER — HYDROMORPHONE HCL 1 MG/ML IJ SOLN
0.5000 mg | INTRAMUSCULAR | Status: DC | PRN
  Administered 2021-07-14 – 2021-07-15 (×3): 1 mg via INTRAVENOUS
  Filled 2021-07-14 (×3): qty 1

## 2021-07-14 MED ORDER — SUGAMMADEX SODIUM 200 MG/2ML IV SOLN
INTRAVENOUS | Status: DC | PRN
Start: 2021-07-14 — End: 2021-07-14
  Administered 2021-07-14: 175 mg via INTRAVENOUS

## 2021-07-14 MED ORDER — ONDANSETRON HCL 4 MG/2ML IJ SOLN: 4.0000 mg | Freq: Once | INTRAMUSCULAR | Status: DC | PRN

## 2021-07-14 MED ORDER — SODIUM CHLORIDE (PF) 0.9 % IJ SOLN
INTRAMUSCULAR | Status: DC | PRN
  Administered 2021-07-14: 20 mL

## 2021-07-14 MED ORDER — PHENYLEPHRINE 40 MCG/ML (10ML) SYRINGE FOR IV PUSH (FOR BLOOD PRESSURE SUPPORT)
PREFILLED_SYRINGE | INTRAVENOUS | Status: DC | PRN
  Administered 2021-07-14: 40 ug via INTRAVENOUS

## 2021-07-14 MED ORDER — SODIUM CHLORIDE 0.45 % IV SOLN: INTRAVENOUS | Status: DC

## 2021-07-14 MED ORDER — PROPOFOL 10 MG/ML IV BOLUS
INTRAVENOUS | Status: AC
  Filled 2021-07-14: qty 20

## 2021-07-14 MED ORDER — ONDANSETRON HCL 4 MG/2ML IJ SOLN
INTRAMUSCULAR | Status: DC | PRN
  Administered 2021-07-14: 4 mg via INTRAVENOUS

## 2021-07-14 MED ORDER — HYDROCODONE-ACETAMINOPHEN 5-325 MG PO TABS: 1.0000 | ORAL_TABLET | Freq: Four times a day (QID) | ORAL | 0 refills | Status: DC | PRN

## 2021-07-14 MED ORDER — SCOPOLAMINE 1 MG/3DAYS TD PT72
MEDICATED_PATCH | TRANSDERMAL | Status: AC
  Filled 2021-07-14: qty 1

## 2021-07-14 MED ORDER — ROCURONIUM BROMIDE 10 MG/ML (PF) SYRINGE
PREFILLED_SYRINGE | INTRAVENOUS | Status: DC | PRN
  Administered 2021-07-14: 70 mg via INTRAVENOUS
  Administered 2021-07-14: 10 mg via INTRAVENOUS

## 2021-07-14 MED ORDER — BUPIVACAINE LIPOSOME 1.3 % IJ SUSP
INTRAMUSCULAR | Status: DC | PRN
  Administered 2021-07-14: 20 mL

## 2021-07-14 MED ORDER — ROCURONIUM BROMIDE 10 MG/ML (PF) SYRINGE
PREFILLED_SYRINGE | INTRAVENOUS | Status: AC
  Filled 2021-07-14: qty 10

## 2021-07-14 MED ORDER — PHENYLEPHRINE 40 MCG/ML (10ML) SYRINGE FOR IV PUSH (FOR BLOOD PRESSURE SUPPORT)
PREFILLED_SYRINGE | INTRAVENOUS | Status: AC
  Filled 2021-07-14: qty 10

## 2021-07-14 MED ORDER — PROPOFOL 500 MG/50ML IV EMUL
INTRAVENOUS | Status: DC | PRN
  Administered 2021-07-14: 250 ug/kg/min via INTRAVENOUS

## 2021-07-14 MED ORDER — DEXAMETHASONE SODIUM PHOSPHATE 10 MG/ML IJ SOLN
INTRAMUSCULAR | Status: AC
  Filled 2021-07-14: qty 1

## 2021-07-14 MED ORDER — BUPIVACAINE LIPOSOME 1.3 % IJ SUSP
INTRAMUSCULAR | Status: AC
  Filled 2021-07-14: qty 20

## 2021-07-14 MED ORDER — PROPOFOL 1000 MG/100ML IV EMUL
INTRAVENOUS | Status: AC
  Filled 2021-07-14: qty 200

## 2021-07-14 MED ORDER — ONDANSETRON HCL 4 MG/2ML IJ SOLN
INTRAMUSCULAR | Status: AC
  Filled 2021-07-14: qty 2

## 2021-07-14 MED ORDER — FENTANYL CITRATE PF 50 MCG/ML IJ SOSY
PREFILLED_SYRINGE | INTRAMUSCULAR | Status: AC
  Filled 2021-07-14: qty 2

## 2021-07-14 MED ORDER — DOCUSATE SODIUM 100 MG PO CAPS: 100.0000 mg | ORAL_CAPSULE | Freq: Two times a day (BID) | ORAL | Status: DC

## 2021-07-14 MED ORDER — HYDROMORPHONE HCL 1 MG/ML IJ SOLN
INTRAMUSCULAR | Status: AC
  Filled 2021-07-14: qty 1

## 2021-07-14 MED ORDER — ACETAMINOPHEN 500 MG PO TABS
1000.0000 mg | ORAL_TABLET | Freq: Once | ORAL | Status: AC
  Administered 2021-07-14: 11:00:00 1000 mg via ORAL
  Filled 2021-07-14: qty 2

## 2021-07-14 MED ORDER — AMISULPRIDE (ANTIEMETIC) 5 MG/2ML IV SOLN: 10.0000 mg | Freq: Once | INTRAVENOUS | Status: DC | PRN

## 2021-07-14 MED ORDER — PROMETHAZINE HCL 12.5 MG PO TABS: 12.5000 mg | ORAL_TABLET | ORAL | 0 refills | Status: DC | PRN

## 2021-07-14 MED ORDER — LIDOCAINE 2% (20 MG/ML) 5 ML SYRINGE
INTRAMUSCULAR | Status: DC | PRN
  Administered 2021-07-14: 60 mg via INTRAVENOUS

## 2021-07-14 MED ORDER — OXYCODONE HCL 5 MG/5ML PO SOLN: 5.0000 mg | Freq: Once | ORAL | Status: DC | PRN

## 2021-07-14 MED ORDER — ORAL CARE MOUTH RINSE: 15.0000 mL | Freq: Once | OROMUCOSAL | Status: AC

## 2021-07-14 MED ORDER — OXYCODONE HCL 5 MG PO TABS
5.0000 mg | ORAL_TABLET | ORAL | Status: DC | PRN
  Administered 2021-07-15: 10:00:00 5 mg via ORAL
  Filled 2021-07-14: qty 1

## 2021-07-14 MED ORDER — LISINOPRIL 20 MG PO TABS
20.0000 mg | ORAL_TABLET | Freq: Every morning | ORAL | Status: DC
  Administered 2021-07-15: 10:00:00 20 mg via ORAL
  Filled 2021-07-14: qty 1

## 2021-07-14 MED ORDER — CEFAZOLIN SODIUM-DEXTROSE 1-4 GM/50ML-% IV SOLN
1.0000 g | Freq: Three times a day (TID) | INTRAVENOUS | Status: AC
  Administered 2021-07-14 – 2021-07-15 (×2): 1 g via INTRAVENOUS
  Filled 2021-07-14 (×2): qty 50

## 2021-07-14 MED ORDER — SUFENTANIL CITRATE 50 MCG/ML IV SOLN
INTRAVENOUS | Status: AC
  Filled 2021-07-14: qty 1

## 2021-07-14 MED ORDER — INSULIN ASPART 100 UNIT/ML IJ SOLN: 0.0000 [IU] | Freq: Three times a day (TID) | INTRAMUSCULAR | Status: DC

## 2021-07-14 MED ORDER — ACETAMINOPHEN 500 MG PO TABS
1000.0000 mg | ORAL_TABLET | Freq: Four times a day (QID) | ORAL | Status: DC
  Administered 2021-07-14 – 2021-07-15 (×3): 1000 mg via ORAL
  Filled 2021-07-14 (×3): qty 2

## 2021-07-14 MED ORDER — LACTATED RINGERS IR SOLN
Status: DC | PRN
  Administered 2021-07-14: 1000 mL

## 2021-07-14 MED ORDER — CHLORHEXIDINE GLUCONATE 0.12 % MT SOLN
15.0000 mL | Freq: Once | OROMUCOSAL | Status: AC
  Administered 2021-07-14: 11:00:00 15 mL via OROMUCOSAL

## 2021-07-14 MED ORDER — PROPOFOL 1000 MG/100ML IV EMUL
INTRAVENOUS | Status: AC
  Filled 2021-07-14: qty 100

## 2021-07-14 MED ORDER — SODIUM CHLORIDE (PF) 0.9 % IJ SOLN
INTRAMUSCULAR | Status: AC
  Filled 2021-07-14: qty 20

## 2021-07-14 MED ORDER — CEFAZOLIN SODIUM-DEXTROSE 2-4 GM/100ML-% IV SOLN
2.0000 g | Freq: Once | INTRAVENOUS | Status: AC
  Administered 2021-07-14: 12:00:00 2 g via INTRAVENOUS
  Filled 2021-07-14: qty 100

## 2021-07-14 MED ORDER — OXYCODONE HCL 5 MG PO TABS: 5.0000 mg | ORAL_TABLET | Freq: Once | ORAL | Status: DC | PRN

## 2021-07-14 MED ORDER — MIDAZOLAM HCL 2 MG/2ML IJ SOLN
INTRAMUSCULAR | Status: DC | PRN
  Administered 2021-07-14: 2 mg via INTRAVENOUS

## 2021-07-14 MED ORDER — SUFENTANIL CITRATE 50 MCG/ML IV SOLN
INTRAVENOUS | Status: DC | PRN
  Administered 2021-07-14: 20 ug via INTRAVENOUS
  Administered 2021-07-14: 10 ug via INTRAVENOUS

## 2021-07-14 MED ORDER — NEBIVOLOL HCL 10 MG PO TABS
20.0000 mg | ORAL_TABLET | Freq: Every day | ORAL | Status: DC
  Administered 2021-07-14: 21:00:00 20 mg via ORAL
  Filled 2021-07-14: qty 2

## 2021-07-14 MED ORDER — KETOROLAC TROMETHAMINE 30 MG/ML IJ SOLN
INTRAMUSCULAR | Status: AC
  Filled 2021-07-14: qty 1

## 2021-07-14 MED ORDER — "VISTASEAL 4 ML SINGLE DOSE KIT "
4.0000 mL | PACK | Freq: Once | CUTANEOUS | Status: AC
  Administered 2021-07-14: 4 mL via TOPICAL
  Filled 2021-07-14: qty 4

## 2021-07-14 MED ORDER — MIDAZOLAM HCL 2 MG/2ML IJ SOLN
INTRAMUSCULAR | Status: AC
  Filled 2021-07-14: qty 2

## 2021-07-14 MED ORDER — SCOPOLAMINE 1 MG/3DAYS TD PT72
1.0000 | MEDICATED_PATCH | TRANSDERMAL | Status: DC
  Administered 2021-07-14: 12:00:00 1 via TRANSDERMAL

## 2021-07-14 MED ORDER — HYDROXYZINE HCL 25 MG PO TABS
25.0000 mg | ORAL_TABLET | Freq: Four times a day (QID) | ORAL | Status: DC | PRN
  Administered 2021-07-15: 10:00:00 25 mg via ORAL
  Filled 2021-07-14: qty 1

## 2021-07-14 MED ORDER — SODIUM CHLORIDE (PF) 0.9 % IJ SOLN
INTRAMUSCULAR | Status: AC
  Filled 2021-07-14: qty 10

## 2021-07-14 MED ORDER — KETOROLAC TROMETHAMINE 30 MG/ML IJ SOLN
INTRAMUSCULAR | Status: DC | PRN
  Administered 2021-07-14: 30 mg via INTRAVENOUS

## 2021-07-14 MED ORDER — LACTATED RINGERS IV SOLN: INTRAVENOUS | Status: DC

## 2021-07-14 MED ORDER — ONDANSETRON HCL 4 MG/2ML IJ SOLN
4.0000 mg | INTRAMUSCULAR | Status: DC | PRN
  Administered 2021-07-14: 18:00:00 4 mg via INTRAVENOUS
  Filled 2021-07-14: qty 2

## 2021-07-14 MED ORDER — HYDROMORPHONE HCL 1 MG/ML IJ SOLN
0.2500 mg | INTRAMUSCULAR | Status: DC | PRN
  Administered 2021-07-14: 16:00:00 0.5 mg via INTRAVENOUS
  Administered 2021-07-14 (×2): 0.25 mg via INTRAVENOUS

## 2021-07-14 MED ORDER — ROSUVASTATIN CALCIUM 10 MG PO TABS
10.0000 mg | ORAL_TABLET | Freq: Every day | ORAL | Status: DC
  Administered 2021-07-14: 21:00:00 10 mg via ORAL
  Filled 2021-07-14: qty 1

## 2021-07-14 MED ORDER — PROPOFOL 10 MG/ML IV BOLUS
INTRAVENOUS | Status: DC | PRN
  Administered 2021-07-14: 150 mg via INTRAVENOUS

## 2021-07-14 MED ORDER — FENTANYL CITRATE PF 50 MCG/ML IJ SOSY
25.0000 ug | PREFILLED_SYRINGE | INTRAMUSCULAR | Status: DC | PRN
  Administered 2021-07-14 (×2): 25 ug via INTRAVENOUS
  Administered 2021-07-14: 15:00:00 50 ug via INTRAVENOUS
  Administered 2021-07-14 (×2): 25 ug via INTRAVENOUS

## 2021-07-14 MED ORDER — BELLADONNA ALKALOIDS-OPIUM 16.2-60 MG RE SUPP: 1.0000 | Freq: Four times a day (QID) | RECTAL | Status: DC | PRN

## 2021-07-14 MED ORDER — DOCUSATE SODIUM 100 MG PO CAPS
100.0000 mg | ORAL_CAPSULE | Freq: Two times a day (BID) | ORAL | Status: DC
  Administered 2021-07-14 – 2021-07-15 (×2): 100 mg via ORAL
  Filled 2021-07-14 (×2): qty 1

## 2021-07-14 SURGICAL SUPPLY — 76 items
ADH SKN CLS APL DERMABOND .7 (GAUZE/BANDAGES/DRESSINGS) ×1
APL LAPSCP 35 DL APL RGD (MISCELLANEOUS) ×1
APL PRP STRL LF DISP 70% ISPRP (MISCELLANEOUS) ×1
APL SRG 32X5 SNPLK LF DISP (MISCELLANEOUS)
APPLICATOR VISTASEAL 35 (MISCELLANEOUS) ×4 IMPLANT
BAG COUNTER SPONGE SURGICOUNT (BAG) IMPLANT
BAG SPEC RTRVL LRG 6X4 10 (ENDOMECHANICALS) ×1
BAG SPNG CNTER NS LX DISP (BAG)
BAG SURGICOUNT SPONGE COUNTING (BAG)
CHLORAPREP W/TINT 26 (MISCELLANEOUS) ×4 IMPLANT
CLIP LIGATING HEM O LOK PURPLE (MISCELLANEOUS) ×4 IMPLANT
CLIP LIGATING HEMO LOK XL GOLD (MISCELLANEOUS) IMPLANT
CLIP LIGATING HEMO O LOK GREEN (MISCELLANEOUS) ×8 IMPLANT
CLIP SUT LAPRA TY ABSORB (SUTURE) ×8 IMPLANT
COVER SURGICAL LIGHT HANDLE (MISCELLANEOUS) ×4 IMPLANT
COVER TIP SHEARS 8 DVNC (MISCELLANEOUS) ×2 IMPLANT
COVER TIP SHEARS 8MM DA VINCI (MISCELLANEOUS) ×3
CUTTER ECHEON FLEX ENDO 45 340 (ENDOMECHANICALS) IMPLANT
DECANTER SPIKE VIAL GLASS SM (MISCELLANEOUS) ×4 IMPLANT
DERMABOND ADVANCED (GAUZE/BANDAGES/DRESSINGS) ×2
DERMABOND ADVANCED .7 DNX12 (GAUZE/BANDAGES/DRESSINGS) ×2 IMPLANT
DRAIN CHANNEL 15F RND FF 3/16 (WOUND CARE) ×2 IMPLANT
DRAPE ARM DVNC X/XI (DISPOSABLE) ×8 IMPLANT
DRAPE COLUMN DVNC XI (DISPOSABLE) ×2 IMPLANT
DRAPE DA VINCI XI ARM (DISPOSABLE) ×12
DRAPE DA VINCI XI COLUMN (DISPOSABLE) ×3
DRAPE INCISE IOBAN 66X45 STRL (DRAPES) ×4 IMPLANT
DRAPE SHEET LG 3/4 BI-LAMINATE (DRAPES) ×4 IMPLANT
ELECT PENCIL ROCKER SW 15FT (MISCELLANEOUS) ×4 IMPLANT
ELECT REM PT RETURN 15FT ADLT (MISCELLANEOUS) ×4 IMPLANT
EVACUATOR SILICONE 100CC (DRAIN) ×2 IMPLANT
GAUZE 4X4 16PLY ~~LOC~~+RFID DBL (SPONGE) ×4 IMPLANT
GLOVE SRG 8 PF TXTR STRL LF DI (GLOVE) ×2 IMPLANT
GLOVE SURG ENC MOIS LTX SZ6.5 (GLOVE) ×4 IMPLANT
GLOVE SURG ENC TEXT LTX SZ7.5 (GLOVE) ×8 IMPLANT
GLOVE SURG UNDER POLY LF SZ8 (GLOVE) ×3
GOWN STRL REUS W/TWL LRG LVL3 (GOWN DISPOSABLE) ×4 IMPLANT
GOWN STRL REUS W/TWL XL LVL3 (GOWN DISPOSABLE) ×8 IMPLANT
HEMOSTAT SURGICEL 4X8 (HEMOSTASIS) ×4 IMPLANT
HOLDER FOLEY CATH W/STRAP (MISCELLANEOUS) ×4 IMPLANT
IRRIG SUCT STRYKERFLOW 2 WTIP (MISCELLANEOUS) ×3
IRRIGATION SUCT STRKRFLW 2 WTP (MISCELLANEOUS) ×2 IMPLANT
KIT BASIN OR (CUSTOM PROCEDURE TRAY) ×4 IMPLANT
KIT TURNOVER KIT A (KITS) IMPLANT
LOOP VESSEL MAXI BLUE (MISCELLANEOUS) IMPLANT
MARKER SKIN DUAL TIP RULER LAB (MISCELLANEOUS) ×4 IMPLANT
NDL INSUFFLATION 14GA 120MM (NEEDLE) ×2 IMPLANT
NEEDLE INSUFFLATION 14GA 120MM (NEEDLE) ×3 IMPLANT
POUCH SPECIMEN RETRIEVAL 10MM (ENDOMECHANICALS) ×4 IMPLANT
PROTECTOR NERVE ULNAR (MISCELLANEOUS) ×8 IMPLANT
RELOAD STAPLE 45 2.6 WHT THIN (STAPLE) IMPLANT
SCISSORS LAP 5X45 EPIX DISP (ENDOMECHANICALS) ×4 IMPLANT
SEAL CANN UNIV 5-8 DVNC XI (MISCELLANEOUS) ×8 IMPLANT
SEAL XI 5MM-8MM UNIVERSAL (MISCELLANEOUS) ×12
SEALANT SURGICAL APPL DUAL CAN (MISCELLANEOUS) IMPLANT
SET TUBE SMOKE EVAC HIGH FLOW (TUBING) ×4 IMPLANT
SOLUTION ELECTROLUBE (MISCELLANEOUS) ×4 IMPLANT
STAPLE RELOAD 45 WHT (STAPLE) IMPLANT
STAPLE RELOAD 45MM WHITE (STAPLE)
SUT ETHILON 2 0 PSLX (SUTURE) ×2 IMPLANT
SUT MNCRL AB 4-0 PS2 18 (SUTURE) ×8 IMPLANT
SUT PDS AB 0 CT1 36 (SUTURE) IMPLANT
SUT V-LOC BARB 180 2/0GR6 GS22 (SUTURE)
SUT VIC AB 1 CT1 36 (SUTURE) ×16 IMPLANT
SUT VICRYL 0 UR6 27IN ABS (SUTURE) IMPLANT
SUT VLOC BARB 180 ABS3/0GR12 (SUTURE) ×6
SUTURE V-LC BRB 180 2/0GR6GS22 (SUTURE) IMPLANT
SUTURE VLOC BRB 180 ABS3/0GR12 (SUTURE) ×4 IMPLANT
TOWEL OR 17X26 10 PK STRL BLUE (TOWEL DISPOSABLE) ×4 IMPLANT
TOWEL OR NON WOVEN STRL DISP B (DISPOSABLE) ×4 IMPLANT
TRAY FOLEY MTR SLVR 16FR STAT (SET/KITS/TRAYS/PACK) ×4 IMPLANT
TRAY LAPAROSCOPIC (CUSTOM PROCEDURE TRAY) ×4 IMPLANT
TROCAR BLADELESS OPT 5 100 (ENDOMECHANICALS) IMPLANT
TROCAR UNIVERSAL OPT 12M 100M (ENDOMECHANICALS) IMPLANT
TROCAR XCEL 12X100 BLDLESS (ENDOMECHANICALS) ×4 IMPLANT
WATER STERILE IRR 1000ML POUR (IV SOLUTION) ×4 IMPLANT

## 2021-07-14 NOTE — Discharge Instructions (Addendum)
Activity:  You are encouraged to ambulate frequently (about every hour during waking hours) to help prevent blood clots from forming in your legs or lungs.  However, you should not engage in any heavy lifting (> 10-15 lbs), strenuous activity, or straining. Diet: You should advance your diet as instructed by your physician.  It will be normal to have some bloating, nausea, and abdominal discomfort intermittently. Prescriptions:  You will be provided a prescription for pain medication to take as needed.  If your pain is not severe enough to require the prescription pain medication, you may take extra strength Tylenol instead which will have less side effects.  You should also take a prescribed stool softener to avoid straining with bowel movements as the prescription pain medication may constipate you. Incisions: You may remove your dressing bandages 48 hours after surgery if not removed in the hospital.  You will either have some small staples or special tissue glue at each of the incision sites. Once the bandages are removed (if present), the incisions may stay open to air.  You may start showering (but not soaking or bathing in water) the 2nd day after surgery and the incisions simply need to be patted dry after the shower.  No additional care is needed. What to call us about: You should call the office [phone]) if you develop fever > 101 or develop persistent vomiting.   You may resume Eliquis on Thursday 07/16/21.  You may resume aspirin, advil, aleve, vitamins, and supplements 7 days after surgery.

## 2021-07-14 NOTE — H&P (Signed)
PRE-OP H&P Office Visit Report     06/30/2021   --------------------------------------------------------------------------------   Angela Washington  MRN: [id number]  DOB: [birth year], 55 year old Female   PRIMARY CARE:  Kennieth Rad, MD  REFERRING:    PROVIDER:  Ellison Hughs, M.D.  TREATING:  Jiles Crocker, NP  LOCATION:  [facility] [phone]     --------------------------------------------------------------------------------   CC: I have a simple kidney cyst.  HPI: Angela Washington is a 55 year-old female established patient who is here for a simple renal cyst.  05/19/17: Multiple bilateral renal cysts, the largest of which is 6.7 cm involving the right kidney seen on CT of the abdomen/pelvis with contrast from 2016. The same right renal cyst was seen on CT chest (12/2016) and measured 6.8 cm. All cysts appear simple in nature. She has no personal/family history of polycystic kidney disease or GU malignancies. She denies right flank pain, early satiety, gematuria or nausea/vomiting.   05/19/2021: The patient is here today for worsening, intermittent, dull and positional right-sided flank pain. She states that the pain has worsened over the past year. She denies early satiety, nausea/vomiting or hematuria. CT from 02/13/2021 revealed interval enlargement of her largest right renal cyst, now measuring 7.4 cm in greatest dimension. She has numerous, simple cysts involving both kidneys, none of which have cancerous features.   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 Lovena Neighbours on 12/20. No changes in past medical history, prescription medications taken on daily basis, no interval surgical or procedural intervention. She continues to work as a Education officer, community. Continues to have intermittent right-sided flank pain and discomfort but not  to the point where she requires any pain medication outside of over-the-counter Tylenol. Denies any new or worsening lower urinary tract symptoms. No interval dysuria or gross hematuria. Denies any recent chest pain, shortness of breath, numbness or tingling in the extremities. Denies headaches or lightheadedness.   The problem is on the right side. She is having pain. The mass has grown in size. She is currently having flank pain and back pain. She denies having groin pain, nausea, vomiting, fever, and chills.   She does not have a history of urinary infections. She has not had kidney surgery.     ALLERGIES: Morphine    No current facility-administered medications on file prior to encounter.   Current Outpatient Medications on File Prior to Encounter  Medication Sig Dispense Refill   BYSTOLIC 20 MG TABS Take 20 mg by mouth at bedtime.  0   cholecalciferol (VITAMIN D3) 25 MCG (1000 UNIT) tablet Take 1,000 Units by mouth daily.     ELIQUIS 5 MG TABS tablet Take 5 mg by mouth 2 (two) times daily.  10   JANUMET XR 50-1000 MG TB24 Take 1 tablet by mouth every morning.     lisinopril (ZESTRIL) 20 MG tablet Take 20 mg by mouth every morning.     rizatriptan (MAXALT) 10 MG tablet Take 10 mg by mouth as needed for migraine. May repeat in 2 hours if needed for migraines.     rosuvastatin (CRESTOR) 10 MG tablet Take 10 mg by mouth at bedtime.        GU PSH: Hysterectomy     NON-GU PSH: Appendectomy Breast Reduction Cesarean Delivery Cholecystectomy (laparoscopic)     GU PMH: Renal cyst - 05/19/2021, (Stable), Bilateral, Benign appearing bilateral renal cysts,  the largest of which is involving the right kidney and measures 6.8 cm, - 2018    NON-GU PMH: Other primary thrombophilia - 2018 Arthritis Diabetes Type 2 DVT, History Hypertension    FAMILY HISTORY: 2 sons - Son   SOCIAL HISTORY: Marital Status: Married Preferred Language: English; Ethnicity: Not Hispanic Or Latino; Race:  White Current Smoking Status: Patient has never smoked.   Tobacco Use Assessment Completed: Used Tobacco in last 30 days? Does not use smokeless tobacco. Has never drank.  Does not use drugs. Drinks 4+ caffeinated drinks per day. Has not had a blood transfusion.    REVIEW OF SYSTEMS:    GU Review Female:   Patient reports get up at night to urinate. Patient denies frequent urination, hard to postpone urination, burning /pain with urination, leakage of urine, stream starts and stops, trouble starting your stream, have to strain to urinate, and being pregnant.  Gastrointestinal (Upper):   Patient denies nausea, vomiting, and indigestion/ heartburn.  Gastrointestinal (Lower):   Patient denies diarrhea and constipation.  Constitutional:   Patient denies fever, night sweats, weight loss, and fatigue.  Skin:   Patient denies skin rash/ lesion and itching.  Eyes:   Patient denies blurred vision and double vision.  Ears/ Nose/ Throat:   Patient denies sore throat and sinus problems.  Hematologic/Lymphatic:   Patient denies swollen glands and easy bruising.  Cardiovascular:   Patient denies leg swelling and chest pains.  Respiratory:   Patient denies cough and shortness of breath.  Endocrine:   Patient denies excessive thirst.  Musculoskeletal:   Patient denies back pain and joint pain.  Neurological:   Patient denies headaches and dizziness.  Psychologic:   Patient denies depression and anxiety.   Notes: Reviewed previous review of systems 08/15/07. No Changes.   VITAL SIGNS:      06/30/2021 11:27 AM  Weight 170 lb / 77.11 kg  Height 64 in / 162.56 cm  BP 128/78 mmHg  Pulse 73 /min  Temperature 97.5 F / 36.3 C  BMI 29.2 kg/m   MULTI-SYSTEM PHYSICAL EXAMINATION:    Constitutional: Well-nourished. No physical deformities. Normally developed. Good grooming.  Neck: Neck symmetrical, not swollen. Normal tracheal position.  Respiratory: No labored breathing, no use of accessory muscles.    Cardiovascular: Normal temperature, normal extremity pulses, no swelling, no varicosities.  Skin: No paleness, no jaundice, no cyanosis. No lesion, no ulcer, no rash.  Neurologic / Psychiatric: Oriented to time, oriented to place, oriented to person. No depression, no anxiety, no agitation.  Gastrointestinal: No mass, no tenderness, no rigidity, non obese abdomen.  Musculoskeletal: Normal gait and station of head and neck.     Complexity of Data:  Source Of History:  Patient, Medical Record Summary  Records Review:   Previous Doctor Records, Previous Hospital Records, Previous Patient Records  Urine Test Review:   Urinalysis  X-Ray Review: C.T. Abdomen/Pelvis: Reviewed Films. Reviewed Report.  CLINICAL DATA: 55 year old female with history of right upper quadrant and right-sided flank pain.  EXAM: CT ABDOMEN AND PELVIS WITH CONTRAST  TECHNIQUE: Multidetector CT imaging of the abdomen and pelvis was performed using the standard protocol following bolus administration of intravenous contrast.  CONTRAST: 58mL OMNIPAQUE IOHEXOL 350 MG/ML SOLN  COMPARISON: CT the abdomen and pelvis 09/26/2014.  FINDINGS: Lower chest: Unremarkable.  Hepatobiliary: Diffuse low attenuation throughout the hepatic parenchyma, concerning for hepatic steatosis (difficult to confirm on today's contrast enhanced examination). No suspicious cystic or solid hepatic lesions. No intra or extrahepatic biliary  ductal dilatation. Status post cholecystectomy.  Pancreas: No pancreatic mass. No pancreatic ductal dilatation. No pancreatic or peripancreatic fluid collections or inflammatory changes.  Spleen: Unremarkable.  Adrenals/Urinary Tract: Multiple low-attenuation lesions are again noted in both kidneys, compatible with simple cysts, largest of which is exophytic in the upper pole of the right kidney (axial image 22 of series 2) measuring 7.4 cm in diameter. In addition, there are innumerable smaller  subcentimeter low-attenuation lesions in both kidneys, too small to characterize, but favored to represent tiny cysts. Multiple small parapelvic cysts are also noted in the kidneys bilaterally (left greater than right). No hydroureteronephrosis. Urinary bladder is normal in appearance. Bilateral adrenal glands are normal in appearance.  Stomach/Bowel: The appearance of the stomach is normal. No pathologic dilatation of small bowel or colon. Status post appendectomy.  Vascular/Lymphatic: No significant atherosclerotic disease, aneurysm or dissection noted in the abdominal or pelvic vasculature. No lymphadenopathy noted in the abdomen or pelvis.  Reproductive: Status post hysterectomy. Right ovary is not confidently identified may be surgically absent or atrophic. Left ovary is unremarkable.  Other: No significant volume of ascites. No pneumoperitoneum.  Musculoskeletal: There are no aggressive appearing lytic or blastic lesions noted in the visualized portions of the skeleton.  IMPRESSION: 1. No acute findings are noted in the abdomen or pelvis to account for the patient's symptoms. 2. Probable hepatic steatosis. 3. Numerous bilateral renal cysts, as above. 4. Additional incidental findings, as above.   Electronically Signed By: Vinnie Langton M.D. On: 02/13/2021 10:56     06/30/21  Urinalysis  Urine Appearance Slightly Cloudy   Urine Color Yellow   Urine Glucose Neg mg/dL  Urine Bilirubin Neg mg/dL  Urine Ketones Neg mg/dL  Urine Specific Gravity 1.020   Urine Blood Neg ery/uL  Urine pH 5.5   Urine Protein Trace mg/dL  Urine Urobilinogen 0.2 mg/dL  Urine Nitrites Neg   Urine Leukocyte Esterase Neg leu/uL  Urine WBC/hpf 0 - 5/hpf   Urine RBC/hpf NS (Not Seen)   Urine Epithelial Cells 0 - 5/hpf   Urine Bacteria Rare (0-9/hpf)   Urine Mucous Present   Urine Yeast NS (Not Seen)   Urine Trichomonas Not Present   Urine Cystals NS (Not Seen)   Urine Casts NS (Not  Seen)   Urine Sperm Not Present    PROCEDURES:          Urinalysis w/Scope Dipstick Dipstick Cont'd Micro  Color: Yellow Bilirubin: Neg mg/dL WBC/hpf: 0 - 5/hpf  Appearance: Slightly Cloudy Ketones: Neg mg/dL RBC/hpf: NS (Not Seen)  Specific Gravity: 1.020 Blood: Neg ery/uL Bacteria: Rare (0-9/hpf)  pH: 5.5 Protein: Trace mg/dL Cystals: NS (Not Seen)  Glucose: Neg mg/dL Urobilinogen: 0.2 mg/dL Casts: NS (Not Seen)    Nitrites: Neg Trichomonas: Not Present    Leukocyte Esterase: Neg leu/uL Mucous: Present      Epithelial Cells: 0 - 5/hpf      Yeast: NS (Not Seen)      Sperm: Not Present    ASSESSMENT:      ICD-10 Details  1 GU:   Renal cyst - N28.1 Chronic, Worsening   PLAN:           Orders Labs Urine Culture          Schedule Return Visit/Planned Activity: Keep Scheduled Appointment - Follow up MD, Schedule Surgery          Document Letter(s):  Created for Patient: Clinical Summary         Notes: - Recent CT  images reviewed with the patient. Her right upper pole renal cyst is directly abutting the posterior aspects of the diaphragm and could potentially be the source of her chronic right flank/back pain.   -The risk, benefits and alternatives of robotic assisted laparoscopic right cyst decortication was discussed in detail. Risk include, but are not limited to, cyst recurrence, bleeding complications, infection, adjacent organ injury, renal loss, MI, CVA, DVT and PE. She voices understanding and wishes to proceed.

## 2021-07-14 NOTE — Anesthesia Preprocedure Evaluation (Signed)
Anesthesia Evaluation  Patient identified by MRN, date of birth, ID band Patient awake    Reviewed: Allergy & Precautions, NPO status , Patient's Chart, lab work & pertinent test results  History of Anesthesia Complications Negative for: history of anesthetic complications  Airway Mallampati: II  TM Distance: >3 FB Neck ROM: Full    Dental  (+) Dental Advisory Given   Pulmonary PE   Pulmonary exam normal        Cardiovascular hypertension, + DVT  Normal cardiovascular exam     Neuro/Psych  Headaches,    GI/Hepatic negative GI ROS, Neg liver ROS,   Endo/Other  negative endocrine ROS  Renal/GU Renal disease (R renal cysts)  negative genitourinary   Musculoskeletal negative musculoskeletal ROS (+)   Abdominal   Peds  Hematology Prothrombin deficiency- on Eliquis   Anesthesia Other Findings   Reproductive/Obstetrics                             Anesthesia Physical Anesthesia Plan  ASA: 2  Anesthesia Plan: General   Post-op Pain Management: Tylenol PO (pre-op), Toradol IV (intra-op) and Lidocaine infusion   Induction: Intravenous  PONV Risk Score and Plan: 4 or greater and Ondansetron, Dexamethasone, Treatment may vary due to age or medical condition and Midazolam  Airway Management Planned: Oral ETT  Additional Equipment: None  Intra-op Plan:   Post-operative Plan: Extubation in OR  Informed Consent: I have reviewed the patients History and Physical, chart, labs and discussed the procedure including the risks, benefits and alternatives for the proposed anesthesia with the patient or authorized representative who has indicated his/her understanding and acceptance.       Plan Discussed with:   Anesthesia Plan Comments:         Anesthesia Quick Evaluation

## 2021-07-14 NOTE — Op Note (Signed)
Operative Note  Preoperative diagnosis:  1.  Multiple right renal cysts 2.  Chronic right flank pain  Postoperative diagnosis: 1.  Multiple right renal cysts 2.  Chronic right flank pain  Procedure(s): 1.  Robot-assisted laparoscopic right renal cyst decortication  Surgeon: Ellison Hughs, MD  Assistants: Debbrah Alar, PA-C  Anesthesia:  General  Complications:  None  EBL: 25 mL  Specimens: 1.  Right upper pole renal cyst 2.  Right midpole renal cyst 3.  Right lower pole renal cyst  Drains/Catheters: 1.  Foley catheter 2.  Right lower quadrant JP drain  Intraoperative findings:   Large right upper pole renal cyst compressing against the dome of the diaphragm.  The cyst arose adjacent to the right adrenal gland that was left intact.  There were what appeared to be hemosiderin deposits within the cyst wall.  The cystic fluid was brown in color, likely from previous internal hemorrhage Simple right lateral and right lower pole renal cyst with clear cystic fluid  Indication:  Angela Washington is a 55 y.o. female with chronic right upper quadrant and flank pain secondary to an enlarging right upper pole renal cyst.  She was noted to have multiple sizable renal cysts involving both kidneys.  She has been consented for the above procedures, voices understanding and wishes to proceed.  Description of procedure:  After informed consent was signed, the patient was taken back to the operating room and properly anesthetized.  The patient was then placed in the left lateral decubitus position with all pressure points padded.  The abdomen was then prepped and draped in the usual sterile fashion.  A time-out was then performed.     An 8 mm incision was then made lateral to the right rectus muscle at the level of the right 12th rib.  A Veress needle was then used to access the abdominal cavity.  A saline drop test showed no signs of obstruction and aspiration of the Veress needle revealed  no blood or sucus.  The abdominal cavity was then insufflated to 15 mmHg.  An 8 mm robotic trocar was then atraumatically inserted into the abdominal cavity.  The robotic camera was then inserted through the port and inspection of the abdominal cavity revealed no evidence of adjacent organ or vessel injury. We then placed three additional 8 mm robotic ports, a 12 mm assistant port and a 5 mm subxiphoid port in such as fashion as to triangulate the right renal hilum.  The robot was then docked into postion.   Using a combination of blunt and cold scissors dissection, the hepatic attachments were released from the abdominal sidewall.  The white line of Toldt along the ascending colon was then incised, allowing [place] to reflect the colon medially and expose the anterior surface of the right kidney.      Once the colon was adequately mobilized, the anterior surface of Gerota's fascia was incised.  The right kidney was mobilized until the right upper pole renal cyst was completely encircled.  The right upper pole renal cyst was sharply incised with expression of brownish fluid that was completely suctioned out of the abdominal cavity.  The right upper quadrant was extensively irrigated with saline multiple times and subsequently evacuated through the suction device.  The right upper pole cyst wall was excised and sent to pathology for permanent section.  The remaining cyst wall cavity was then fulgurated.  Vistaseal was then injected into the cyst wall and a flap of perinephric fat was  interposed within the cyst cavity and secured in place with a 3 OV lock suture.  The right lateral cyst wall was mobilized in a similar fashion.  The anterior surface of the cyst was sharply incised with expression of clear fluid that was completely suctioned from the abdominal cavity.  The cyst wall was then excised and sent to pathology for permanent section.  There was an additional cyst deeper to the right lateral cyst that was left  intact due to its close proximity to the collecting system on cross-sectional imaging.  A large right lower pole cyst was mobilized and sharply incised with scissors.  The cystic fluid appeared clear and was completely suctioned from the abdominal cavity.  That cyst wall was also excised and sent for permanent section.  A flap of perinephric fat was then used to overlay both the lateral cyst cavity as well as the inferior pole cyst cavity.  This perinephric fat flap was secured in place using a 3 OV lock suture.  A 14 [place] JP drain was then inserted into the right lateral assistant port and left in place in the right retroperitoneal space.  The JP drain was then placed to bulb suction and secured in place using a nylon suture.  The 12 mm midline assistant port was then closed using the [employer] technique with a 0 Vicryl suture.  The skin incisions were then closed using 4-0 Monocryl.  Dermabond was applied to all skin incisions.  The patient tolerated the procedure well and was transferred to the postanesthesia in stable condition.  Plan: Monitor on the floor overnight.  JP creatinine and voiding trial in the morning

## 2021-07-14 NOTE — Anesthesia Procedure Notes (Signed)
Procedure Name: Intubation Date/Time: 07/14/2021 12:12 PM Performed by: Sharlette Dense, CRNA Pre-anesthesia Checklist: Patient identified, Emergency Drugs available, Suction available and Patient being monitored Patient Re-evaluated:Patient Re-evaluated prior to induction Oxygen Delivery Method: Circle system utilized Preoxygenation: Pre-oxygenation with 100% oxygen Induction Type: IV induction Ventilation: Mask ventilation without difficulty Laryngoscope Size: Miller and 2 Grade View: Grade I Tube type: Oral Tube size: 7.0 mm Number of attempts: 1 Airway Equipment and Method: Stylet and Oral airway Placement Confirmation: ETT inserted through vocal cords under direct vision, positive ETCO2 and breath sounds checked- equal and bilateral Secured at: 21 cm Tube secured with: Tape Dental Injury: Teeth and Oropharynx as per pre-operative assessment

## 2021-07-14 NOTE — Anesthesia Postprocedure Evaluation (Signed)
Anesthesia Post Note  Patient: Angela Washington  Procedure(s) Performed: XI ROBOT ASSISTED RENAL CYST DECORTICATION (Right: Abdomen)     Patient location during evaluation: PACU Anesthesia Type: General Level of consciousness: awake and alert Pain management: pain level controlled Vital Signs Assessment: post-procedure vital signs reviewed and stable Respiratory status: spontaneous breathing, nonlabored ventilation and respiratory function stable Cardiovascular status: blood pressure returned to baseline and stable Postop Assessment: no apparent nausea or vomiting Anesthetic complications: no   No notable events documented.  Last Vitals:  Vitals:   07/14/21 1600 07/14/21 1649  BP: 114/75 120/73  Pulse: (!) 59 68  Resp: 11 16  Temp: 36.4 C 36.8 C  SpO2: 99% 95%    Last Pain:  Vitals:   07/14/21 1649  TempSrc: Oral  PainSc:                  Lidia Collum

## 2021-07-14 NOTE — Transfer of Care (Signed)
Immediate Anesthesia Transfer of Care Note  Patient: Angela Washington  Procedure(s) Performed: XI ROBOT ASSISTED RENAL CYST DECORTICATION (Right: Abdomen)  Patient Location: PACU  Anesthesia Type:General  Level of Consciousness: drowsy  Airway & Oxygen Therapy: Patient Spontanous Breathing and Patient connected to face mask oxygen  Post-op Assessment: Report given to RN and Post -op Vital signs reviewed and stable  Post vital signs: Reviewed and stable  Last Vitals:  Vitals Value Taken Time  BP 141/71 07/14/21 1427  Temp    Pulse 65 07/14/21 1428  Resp 15 07/14/21 1428  SpO2 100 % 07/14/21 1428  Vitals shown include unvalidated device data.  Last Pain:  Vitals:   07/14/21 1023  TempSrc: Oral  PainSc: 0-No pain         Complications: No notable events documented.

## 2021-07-15 DIAGNOSIS — I10 Essential (primary) hypertension: Secondary | ICD-10-CM | POA: Diagnosis not present

## 2021-07-15 DIAGNOSIS — Z79899 Other long term (current) drug therapy: Secondary | ICD-10-CM | POA: Diagnosis not present

## 2021-07-15 DIAGNOSIS — Z7901 Long term (current) use of anticoagulants: Secondary | ICD-10-CM | POA: Diagnosis not present

## 2021-07-15 DIAGNOSIS — Z7984 Long term (current) use of oral hypoglycemic drugs: Secondary | ICD-10-CM | POA: Diagnosis not present

## 2021-07-15 DIAGNOSIS — N281 Cyst of kidney, acquired: Secondary | ICD-10-CM | POA: Diagnosis not present

## 2021-07-15 DIAGNOSIS — E119 Type 2 diabetes mellitus without complications: Secondary | ICD-10-CM | POA: Diagnosis not present

## 2021-07-15 LAB — BASIC METABOLIC PANEL
Anion gap: 10 (ref 5–15)
BUN: 15 mg/dL (ref 6–20)
CO2: 25 mmol/L (ref 22–32)
Calcium: 8.8 mg/dL — ABNORMAL LOW (ref 8.9–10.3)
Chloride: 101 mmol/L (ref 98–111)
Creatinine, Ser: 0.83 mg/dL (ref 0.44–1.00)
GFR, Estimated: >60 mL/min (ref >60)
Glucose, Bld: 190 mg/dL — ABNORMAL HIGH (ref 70–99)
Potassium: 4.2 mmol/L (ref 3.5–5.1)
Sodium: 136 mmol/L (ref 135–145)

## 2021-07-15 LAB — GLUCOSE, CAPILLARY
Glucose-Capillary: 181 mg/dL — ABNORMAL HIGH (ref 70–99)
Glucose-Capillary: 209 mg/dL — ABNORMAL HIGH (ref 70–99)

## 2021-07-15 LAB — HEMOGLOBIN AND HEMATOCRIT, BLOOD
HCT: 39.4 % (ref 36.0–46.0)
HCT: 40.8 % (ref 36.0–46.0)
Hemoglobin: 13.6 g/dL (ref 12.0–15.0)
Hemoglobin: 14.2 g/dL (ref 12.0–15.0)

## 2021-07-15 NOTE — Discharge Summary (Signed)
Date of admission: 07/14/2021  Date of discharge: 07/15/2021  Admission diagnosis: Right renal cysts  Discharge diagnosis: same  Secondary diagnoses: DM II, primary thrombophilia, arthritis, hx of DVT, HTN  History and Physical: For full details, please see admission history and physical. Briefly, Angela Washington is a 55 y.o. year old patient with worsening, intermittent, dull and positional right-sided flank pain. She states that the pain has worsened over the past year. She denies early satiety, nausea/vomiting or hematuria. CT from 02/13/2021 revealed interval enlargement of her largest right renal cyst, now measuring 7.4 cm in greatest dimension. She has numerous, simple cysts involving both kidneys, none of which have cancerous features.    Hospital Course: Pt was admitted and taken to the OR on 07/14/21 for a robotic assisted lap right renal cyst decortication.  Pt tolerated the procedure well and was hemodynamically stable throughout.  She was extubated without complication and woke up from anesthesia neurologically intact.  She was transferred from the OR to PACU and then to the floor without difficulty.  Post op course progressed as expected.  On POD 1 she was ambulating, passing flatus, and tolerating a regular diet.  Foley was removed and she was able to void without difficulty.  JP drain was removed without issue.  Pain was well controlled and she was felt stable for d/c home.   Laboratory values:  Recent Labs    07/14/21 1507 07/15/21 0429 07/15/21 1024  HGB 15.2* 13.6 14.2  HCT 44.7 39.4 40.8   Recent Labs    07/15/21 0429  CREATININE 0.83    Disposition: Home  Discharge instruction: The patient was instructed to be ambulatory but told to refrain from heavy lifting, strenuous activity, or driving.   Discharge medications:  Allergies as of 07/15/2021       Reactions   Morphine And Related Hives        Medication List     STOP taking these medications     cholecalciferol 25 MCG (1000 UNIT) tablet Commonly known as: VITAMIN D3   Eliquis 5 MG Tabs tablet Generic drug: apixaban       TAKE these medications    Bystolic 20 MG Tabs Generic drug: Nebivolol HCl Take 20 mg by mouth at bedtime.   docusate sodium 100 MG capsule Commonly known as: COLACE Take 1 capsule (100 mg total) by mouth 2 (two) times daily.   HYDROcodone-acetaminophen 5-325 MG tablet Commonly known as: Norco Take 1-2 tablets by mouth every 6 (six) hours as needed for moderate pain.   Janumet XR 50-1000 MG Tb24 Generic drug: SitaGLIPtin-MetFORMIN HCl Take 1 tablet by mouth every morning.   lisinopril 20 MG tablet Commonly known as: ZESTRIL Take 20 mg by mouth every morning.   promethazine 12.5 MG tablet Commonly known as: PHENERGAN Take 1 tablet (12.5 mg total) by mouth every 4 (four) hours as needed for nausea or vomiting.   rizatriptan 10 MG tablet Commonly known as: MAXALT Take 10 mg by mouth as needed for migraine. May repeat in 2 hours if needed for migraines.   rosuvastatin 10 MG tablet Commonly known as: CRESTOR Take 10 mg by mouth at bedtime.        Followup:   Follow-up Information     Karen Kays, NP Follow up on 07/29/2021.   Specialty: Nurse Practitioner Why: at 11:30 Contact information: [address] [place] [address] [phone]

## 2021-07-15 NOTE — Progress Notes (Addendum)
1 Day Post-Op Subjective: Patient reports soreness but otherwise feels well.  Denies N/V.  Passing small amount of flatus.  Has ambulated and is using IS.    Objective: Vital signs in last 24 hours: Temp:  [95.7 F (35.4 C)-98.8 F (37.1 C)] 97.7 F (36.5 C) (12/21 0512) Pulse Rate:  [56-76] 76 (12/21 0512) Resp:  [9-26] 16 (12/21 0512) BP: (113-142)/(62-92) 113/62 (12/21 0512) SpO2:  [95 %-100 %] 99 % (12/21 0512) Weight:  [78.9 kg] 78.9 kg (12/20 1023)  Intake/Output from previous day: 12/20 0701 - 12/21 0700 In: 2620.5 [P.O.:240; I.V.:2180.5; IV Piggyback:200] Out: 1260 [Urine:1100; Drains:110; Blood:50] Intake/Output this shift: No intake/output data recorded.  Physical Exam:  General:alert, cooperative, and no distress Cardiovascular: RRR Lungs: non labored breathing GI: soft; ND, approp tender; faint BS Incisions: C/D/I Urine: clear Extremities: SCDs in place  Lab Results: Recent Labs    07/14/21 1507 07/15/21 0429  HGB 15.2* 13.6  HCT 44.7 39.4   BMET Recent Labs    07/15/21 0429  NA 136  K 4.2  CL 101  CO2 25  GLUCOSE 190*  BUN 15  CREATININE 0.83  CALCIUM 8.8*   No results for input(s): LABPT, INR in the last 72 hours. No results for input(s): LABURIN in the last 72 hours. Results for orders placed or performed in visit on 07/10/21  SARS Coronavirus 2 (TAT 6-24 hrs)     Status: None   Collection Time: 07/10/21 12:00 AM  Result Value Ref Range Status   SARS Coronavirus 2 RESULT: NEGATIVE  Final    Comment: RESULT: NEGATIVESARS-CoV-2 INTERPRETATION:A NEGATIVE  test result means that SARS-CoV-2 RNA was not present in the specimen above the limit of detection of this test. This does not preclude a possible SARS-CoV-2 infection and should not be used as the  sole basis for patient management decisions. Negative results must be combined with clinical observations, patient history, and epidemiological information. Optimum specimen types and timing for  peak viral levels during infections caused by SARS-CoV-2  have not been determined. Collection of multiple specimens or types of specimens may be necessary to detect virus. Improper specimen collection and handling, sequence variability under primers/probes, or organism present below the limit of detection may  lead to false negative results. Positive and negative predictive values of testing are highly dependent on prevalence. False negative test results are more likely when prevalence of disease is high.The expected result is NEGATIVE.Fact S heet for  Healthcare Providers: LocalChronicle.no Sheet for Patients: SalonLookup.es Reference Range - Negative     Studies/Results: No results found.  Assessment/Plan: 1 Day Post-Op, Procedure(s) (LRB): XI ROBOT ASSISTED RENAL CYST DECORTICATION (Right)  Ambulate, Incentive spirometry DVT prophylaxis Transition to PO pain medications D/C perinephric drain SL IVF Advance diet 2g drop in Hg.  Small EBL and likely hemodilutional.  Will re-check at 11 to ensure stable.  D/c foley with TOV Likely d/c home later today   LOS: 0 days   Debbrah Alar 07/15/2021, 8:15 AM

## 2021-07-16 LAB — SURGICAL PATHOLOGY

## 2021-07-29 DIAGNOSIS — N281 Cyst of kidney, acquired: Secondary | ICD-10-CM | POA: Diagnosis not present

## 2021-08-11 DIAGNOSIS — Z90710 Acquired absence of both cervix and uterus: Secondary | ICD-10-CM | POA: Diagnosis not present

## 2021-08-11 DIAGNOSIS — D6859 Other primary thrombophilia: Secondary | ICD-10-CM | POA: Diagnosis not present

## 2021-08-11 DIAGNOSIS — E119 Type 2 diabetes mellitus without complications: Secondary | ICD-10-CM | POA: Diagnosis not present

## 2021-08-11 DIAGNOSIS — Z01419 Encounter for gynecological examination (general) (routine) without abnormal findings: Secondary | ICD-10-CM | POA: Diagnosis not present

## 2021-10-20 DIAGNOSIS — R1084 Generalized abdominal pain: Secondary | ICD-10-CM | POA: Diagnosis not present

## 2021-10-20 DIAGNOSIS — N281 Cyst of kidney, acquired: Secondary | ICD-10-CM | POA: Diagnosis not present

## 2021-11-20 DIAGNOSIS — Z1231 Encounter for screening mammogram for malignant neoplasm of breast: Secondary | ICD-10-CM | POA: Diagnosis not present

## 2021-11-30 DIAGNOSIS — R1084 Generalized abdominal pain: Secondary | ICD-10-CM | POA: Diagnosis not present

## 2021-11-30 DIAGNOSIS — N281 Cyst of kidney, acquired: Secondary | ICD-10-CM | POA: Diagnosis not present

## 2021-12-25 DIAGNOSIS — M67871 Other specified disorders of synovium, right ankle and foot: Secondary | ICD-10-CM | POA: Diagnosis not present

## 2021-12-25 DIAGNOSIS — M67872 Other specified disorders of synovium, left ankle and foot: Secondary | ICD-10-CM | POA: Diagnosis not present

## 2022-01-11 DIAGNOSIS — M67871 Other specified disorders of synovium, right ankle and foot: Secondary | ICD-10-CM | POA: Diagnosis not present

## 2022-01-11 DIAGNOSIS — M67872 Other specified disorders of synovium, left ankle and foot: Secondary | ICD-10-CM | POA: Diagnosis not present

## 2022-01-25 DIAGNOSIS — M67871 Other specified disorders of synovium, right ankle and foot: Secondary | ICD-10-CM | POA: Diagnosis not present

## 2022-01-25 DIAGNOSIS — M67872 Other specified disorders of synovium, left ankle and foot: Secondary | ICD-10-CM | POA: Diagnosis not present

## 2022-03-15 DIAGNOSIS — M67872 Other specified disorders of synovium, left ankle and foot: Secondary | ICD-10-CM | POA: Diagnosis not present

## 2022-03-15 DIAGNOSIS — M79671 Pain in right foot: Secondary | ICD-10-CM | POA: Diagnosis not present

## 2022-03-15 DIAGNOSIS — M67871 Other specified disorders of synovium, right ankle and foot: Secondary | ICD-10-CM | POA: Diagnosis not present

## 2022-03-15 DIAGNOSIS — M25571 Pain in right ankle and joints of right foot: Secondary | ICD-10-CM | POA: Diagnosis not present

## 2022-03-16 DIAGNOSIS — I10 Essential (primary) hypertension: Secondary | ICD-10-CM | POA: Diagnosis not present

## 2022-03-16 DIAGNOSIS — Z86010 Personal history of colonic polyps: Secondary | ICD-10-CM | POA: Diagnosis not present

## 2022-03-16 DIAGNOSIS — E669 Obesity, unspecified: Secondary | ICD-10-CM | POA: Diagnosis not present

## 2022-03-16 DIAGNOSIS — Z1211 Encounter for screening for malignant neoplasm of colon: Secondary | ICD-10-CM | POA: Diagnosis not present

## 2022-03-19 DIAGNOSIS — M25571 Pain in right ankle and joints of right foot: Secondary | ICD-10-CM | POA: Diagnosis not present

## 2022-03-30 DIAGNOSIS — K5730 Diverticulosis of large intestine without perforation or abscess without bleeding: Secondary | ICD-10-CM | POA: Diagnosis not present

## 2022-03-30 DIAGNOSIS — N281 Cyst of kidney, acquired: Secondary | ICD-10-CM | POA: Diagnosis not present

## 2022-03-30 DIAGNOSIS — R1084 Generalized abdominal pain: Secondary | ICD-10-CM | POA: Diagnosis not present

## 2022-03-30 DIAGNOSIS — Q613 Polycystic kidney, unspecified: Secondary | ICD-10-CM | POA: Diagnosis not present

## 2022-03-30 DIAGNOSIS — K760 Fatty (change of) liver, not elsewhere classified: Secondary | ICD-10-CM | POA: Diagnosis not present

## 2022-03-30 DIAGNOSIS — N200 Calculus of kidney: Secondary | ICD-10-CM | POA: Diagnosis not present

## 2022-04-02 DIAGNOSIS — M6701 Short Achilles tendon (acquired), right ankle: Secondary | ICD-10-CM | POA: Diagnosis not present

## 2022-04-02 DIAGNOSIS — M67871 Other specified disorders of synovium, right ankle and foot: Secondary | ICD-10-CM | POA: Diagnosis not present

## 2022-04-02 DIAGNOSIS — M898X7 Other specified disorders of bone, ankle and foot: Secondary | ICD-10-CM | POA: Diagnosis not present

## 2022-04-06 DIAGNOSIS — E119 Type 2 diabetes mellitus without complications: Secondary | ICD-10-CM | POA: Diagnosis not present

## 2022-04-23 NOTE — Progress Notes (Unsigned)
Office Visit Note  Patient: Angela Washington             Date of Birth: February 21, 1966           MRN: [id number]             PCP: Kennieth Rad, MD Referring: Kennieth Rad, MD Visit Date: 04/27/2022 Occupation: '@GUAROCC'$ @  Subjective:  No chief complaint on file.   History of Present Illness: Angela Washington is a 56 y.o. female ***   Activities of Daily Living:  Patient reports morning stiffness for *** {minute/hour:19697}.   Patient {ACTIONS;DENIES/REPORTS:21021675::"Denies"} nocturnal pain.  Difficulty dressing/grooming: {ACTIONS;DENIES/REPORTS:21021675::"Denies"} Difficulty climbing stairs: {ACTIONS;DENIES/REPORTS:21021675::"Denies"} Difficulty getting out of chair: {ACTIONS;DENIES/REPORTS:21021675::"Denies"} Difficulty using hands for taps, buttons, cutlery, and/or writing: {ACTIONS;DENIES/REPORTS:21021675::"Denies"}  No Rheumatology ROS completed.   PMFS History:  Patient Active Problem List   Diagnosis Date Noted   Renal cyst 07/14/2021   ANA positive 05/19/2017   Family history of lupus erythematosus 05/19/2017   History of hypertension 05/19/2017   History of hypercholesterolemia 05/19/2017   History of IBS 05/19/2017   History of migraine 05/19/2017   SVT (supraventricular tachycardia) ([place]) 05/19/2017   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[place] [facility]) 05/19/2017   Primary osteoarthritis of both knees 10/21/2016   Other psoriasis 10/21/2016   Pain in joints of both feet 10/21/2016   S/P hysterectomy 08/22/2013   RLQ abdominal pain 02/25/2012   Ovarian torsion 02/25/2012    Past Medical History:  Diagnosis Date   DVT (deep venous thrombosis) ([place] [facility])    age 28   Headache(784.0)    Hyperlipidemia 2008   diet controlled   Hypertension    Peripheral vascular disease ([facility]) 2008   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   PONV (postoperative nausea and vomiting)    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[facility])    Thrombophilia   Tachycardia 2008   with the PE    Family History  Problem Relation Age of  Onset   Heart attack Father    Parkinsonism Mother    Heart attack Mother    Thyroid cancer Sister    Past Surgical History:  Procedure Laterality Date   APPENDECTOMY  2013   BREAST REDUCTION SURGERY  07/26/1998   CESAREAN SECTION  1994, 1998   2   CHOLECYSTECTOMY  1999   LAPAROSCOPY  02/24/2012   Procedure: LAPAROSCOPY OPERATIVE;  Surgeon: Marvene Staff, MD;  Location: [facility];  Service: Gynecology;  Laterality: Right;  with Lyses of Adhesions   ROBOTIC ASSISTED TOTAL HYSTERECTOMY N/A 08/22/2013   Procedure: ROBOTIC ASSISTED TOTAL HYSTERECTOMY/BILATERAL SALPINGECTOMY;  Surgeon: Marvene Staff, MD;  Location: [facility];  Service: Gynecology;  Laterality: N/A;   SALPINGOOPHORECTOMY  02/24/2012   Procedure: SALPINGO OOPHERECTOMY;  Surgeon: Marvene Staff, MD;  Location: [facility];  Service: Gynecology;  Laterality: Right;  Right Salpingo-Oophorectomy; Left Salpingectomy   TONSILLECTOMY     age 89   [place]   Social History   Social History Narrative   Not on file    There is no immunization history on file for this patient.   Objective: Vital Signs: LMP 08/10/2013 Comment: ablation   Physical Exam   Musculoskeletal Exam: ***  CDAI Exam: CDAI Score: -- Patient Global: --; Provider Global: -- Swollen: --; Tender: -- Joint Exam 04/27/2022   No joint exam has been documented for this visit   There is currently no information documented on the homunculus. Go to the Rheumatology activity and complete the homunculus joint exam.  Investigation: No  additional findings.  Imaging: No results found.  Recent Labs: Lab Results  Component Value Date   WBC 7.2 07/07/2021   HGB 14.2 07/15/2021   PLT 265 07/07/2021   NA 136 07/15/2021   K 4.2 07/15/2021   CL 101 07/15/2021   CO2 25 07/15/2021   GLUCOSE 190 (H) 07/15/2021   BUN 15 07/15/2021   CREATININE 0.83 07/15/2021   BILITOT 0.5 05/22/2018   ALKPHOS 72 11/22/2017   AST 24 05/22/2018   ALT  41 (H) 05/22/2018   PROT 7.2 05/22/2018   ALBUMIN 3.7 11/22/2017   CALCIUM 8.8 (L) 07/15/2021   GFRAA 75 05/22/2018    Speciality Comments: No specialty comments available.  Procedures:  No procedures performed Allergies: Morphine and related   Assessment / Plan:     Visit Diagnoses: Polyarthralgia  Positive ANA (antinuclear antibody) - 1:320 NS - She has no clinical features of autoimmune disease at this time.  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 She is on Eliquis.  Family history of lupus erythematosus - maternal aunt  Orders: No orders of the defined types were placed in this encounter.  No orders of the defined types were placed in this encounter.   Face-to-face time spent with patient was *** minutes. Greater than 50% of time was spent in counseling and coordination of care.  Follow-Up Instructions: No follow-ups on file.   Ofilia Neas, PA-C  Note - This record has been created using Dragon software.  Chart creation errors have been sought, but may not always  have been located. Such creation errors do not reflect on  the standard of medical care.,

## 2022-04-27 ENCOUNTER — Ambulatory Visit (INDEPENDENT_AMBULATORY_CARE_PROVIDER_SITE_OTHER): Payer: BC Managed Care – PPO

## 2022-04-27 ENCOUNTER — Ambulatory Visit: Payer: BC Managed Care – PPO | Attending: Physician Assistant | Admitting: Physician Assistant

## 2022-04-27 ENCOUNTER — Encounter: Payer: Self-pay | Admitting: Physician Assistant

## 2022-04-27 VITALS — BP 125/88 | HR 77 | Resp 16 | Ht 64.0 in | Wt 181.4 lb

## 2022-04-27 DIAGNOSIS — M545 Low back pain, unspecified: Secondary | ICD-10-CM

## 2022-04-27 DIAGNOSIS — M170 Bilateral primary osteoarthritis of knee: Secondary | ICD-10-CM | POA: Diagnosis not present

## 2022-04-27 DIAGNOSIS — Z8719 Personal history of other diseases of the digestive system: Secondary | ICD-10-CM

## 2022-04-27 DIAGNOSIS — M7062 Trochanteric bursitis, left hip: Secondary | ICD-10-CM

## 2022-04-27 DIAGNOSIS — G8929 Other chronic pain: Secondary | ICD-10-CM

## 2022-04-27 DIAGNOSIS — M7662 Achilles tendinitis, left leg: Secondary | ICD-10-CM

## 2022-04-27 DIAGNOSIS — L409 Psoriasis, unspecified: Secondary | ICD-10-CM

## 2022-04-27 DIAGNOSIS — M79672 Pain in left foot: Secondary | ICD-10-CM

## 2022-04-27 DIAGNOSIS — Z8679 Personal history of other diseases of the circulatory system: Secondary | ICD-10-CM

## 2022-04-27 DIAGNOSIS — M7661 Achilles tendinitis, right leg: Secondary | ICD-10-CM

## 2022-04-27 DIAGNOSIS — M79671 Pain in right foot: Secondary | ICD-10-CM | POA: Diagnosis not present

## 2022-04-27 DIAGNOSIS — Z9049 Acquired absence of other specified parts of digestive tract: Secondary | ICD-10-CM

## 2022-04-27 DIAGNOSIS — M546 Pain in thoracic spine: Secondary | ICD-10-CM

## 2022-04-27 DIAGNOSIS — Z8639 Personal history of other endocrine, nutritional and metabolic disease: Secondary | ICD-10-CM

## 2022-04-27 DIAGNOSIS — M2550 Pain in unspecified joint: Secondary | ICD-10-CM | POA: Diagnosis not present

## 2022-04-27 DIAGNOSIS — M5384 Other specified dorsopathies, thoracic region: Secondary | ICD-10-CM | POA: Diagnosis not present

## 2022-04-27 DIAGNOSIS — I471 Supraventricular tachycardia, unspecified: Secondary | ICD-10-CM

## 2022-04-27 DIAGNOSIS — R768 Other specified abnormal immunological findings in serum: Secondary | ICD-10-CM | POA: Diagnosis not present

## 2022-04-27 DIAGNOSIS — D6852 Prothrombin gene mutation: Secondary | ICD-10-CM

## 2022-04-27 DIAGNOSIS — Z840 Family history of diseases of the skin and subcutaneous tissue: Secondary | ICD-10-CM

## 2022-04-27 DIAGNOSIS — M722 Plantar fascial fibromatosis: Secondary | ICD-10-CM

## 2022-04-27 DIAGNOSIS — Z87898 Personal history of other specified conditions: Secondary | ICD-10-CM

## 2022-04-27 DIAGNOSIS — Z8669 Personal history of other diseases of the nervous system and sense organs: Secondary | ICD-10-CM

## 2022-04-27 DIAGNOSIS — R7689 Other specified abnormal immunological findings in serum: Secondary | ICD-10-CM

## 2022-04-28 NOTE — Progress Notes (Signed)
ESR WNL. CRP WNL. RF negative.  CK WNL.

## 2022-04-29 LAB — ANA: Anti Nuclear Antibody (ANA): POSITIVE — AB

## 2022-04-29 LAB — RHEUMATOID FACTOR: Rheumatoid fact SerPl-aCnc: <14 IU/mL (ref <14)

## 2022-04-29 LAB — HLA-B27 ANTIGEN: HLA-B27 Antigen: NEGATIVE

## 2022-04-29 LAB — CK: Total CK: 75 U/L (ref 29–143)

## 2022-04-29 LAB — ANTI-NUCLEAR AB-TITER (ANA TITER): ANA Titer 1: 1:80 {titer} — ABNORMAL HIGH

## 2022-04-29 LAB — C-REACTIVE PROTEIN: CRP: 3.7 mg/L (ref <8.0)

## 2022-04-29 LAB — CYCLIC CITRUL PEPTIDE ANTIBODY, IGG: Cyclic Citrullin Peptide Ab: <16 UNITS

## 2022-04-29 LAB — SEDIMENTATION RATE: Sed Rate: 9 mm/h (ref 0–30)

## 2022-04-29 NOTE — Progress Notes (Signed)
ANA remains positive but is a lower titer. HLA-B27 negative.

## 2022-05-11 ENCOUNTER — Ambulatory Visit: Payer: BC Managed Care – PPO | Attending: Physician Assistant | Admitting: Physician Assistant

## 2022-05-11 ENCOUNTER — Encounter: Payer: Self-pay | Admitting: Physician Assistant

## 2022-05-11 VITALS — BP 132/85 | HR 63 | Resp 16 | Ht 64.0 in | Wt 183.8 lb

## 2022-05-11 DIAGNOSIS — Z87898 Personal history of other specified conditions: Secondary | ICD-10-CM

## 2022-05-11 DIAGNOSIS — M4810 Ankylosing hyperostosis [Forestier], site unspecified: Secondary | ICD-10-CM | POA: Diagnosis not present

## 2022-05-11 DIAGNOSIS — R768 Other specified abnormal immunological findings in serum: Secondary | ICD-10-CM | POA: Diagnosis not present

## 2022-05-11 DIAGNOSIS — D6852 Prothrombin gene mutation: Secondary | ICD-10-CM

## 2022-05-11 DIAGNOSIS — M5136 Other intervertebral disc degeneration, lumbar region: Secondary | ICD-10-CM

## 2022-05-11 DIAGNOSIS — M5134 Other intervertebral disc degeneration, thoracic region: Secondary | ICD-10-CM

## 2022-05-11 DIAGNOSIS — Z9049 Acquired absence of other specified parts of digestive tract: Secondary | ICD-10-CM

## 2022-05-11 DIAGNOSIS — I471 Supraventricular tachycardia, unspecified: Secondary | ICD-10-CM

## 2022-05-11 DIAGNOSIS — L409 Psoriasis, unspecified: Secondary | ICD-10-CM | POA: Diagnosis not present

## 2022-05-11 DIAGNOSIS — Z8639 Personal history of other endocrine, nutritional and metabolic disease: Secondary | ICD-10-CM

## 2022-05-11 DIAGNOSIS — M7062 Trochanteric bursitis, left hip: Secondary | ICD-10-CM

## 2022-05-11 DIAGNOSIS — M5384 Other specified dorsopathies, thoracic region: Secondary | ICD-10-CM

## 2022-05-11 DIAGNOSIS — Z8679 Personal history of other diseases of the circulatory system: Secondary | ICD-10-CM

## 2022-05-11 DIAGNOSIS — M170 Bilateral primary osteoarthritis of knee: Secondary | ICD-10-CM

## 2022-05-11 DIAGNOSIS — Z8719 Personal history of other diseases of the digestive system: Secondary | ICD-10-CM

## 2022-05-11 DIAGNOSIS — Z840 Family history of diseases of the skin and subcutaneous tissue: Secondary | ICD-10-CM

## 2022-05-11 DIAGNOSIS — Z8669 Personal history of other diseases of the nervous system and sense organs: Secondary | ICD-10-CM

## 2022-05-11 NOTE — Progress Notes (Signed)
Office Visit Note  Patient: Angela Washington             Date of Birth: 03-06-1966           MRN: [id number]             PCP: Kennieth Rad, MD Referring: Kennieth Rad, MD Visit Date: 05/11/2022 Occupation: @GUAROCC @  Subjective:  Discuss lab results  History of Present Illness: Angela Washington is a 56 y.o. female with history of positive ANA, polyarthralgia, and DDD.  Patient presents today to discuss recent x-ray results and lab results from her new patient appointment.  She continues to have ongoing pain involving multiple joints as well as limited range of motion in her spine.  Her pain and stiffness has been most severe in the thoracic spine and both feet.  She denies any joint swelling at this time.  She denies any active psoriasis currently.  Activities of Daily Living:  Patient reports morning stiffness for several hours.   Patient Reports nocturnal pain.  Difficulty dressing/grooming: Denies Difficulty climbing stairs: Denies Difficulty getting out of chair: Denies Difficulty using hands for taps, buttons, cutlery, and/or writing: Denies  Review of Systems  Constitutional:  Positive for fatigue.  HENT:  Negative for mouth sores and mouth dryness.   Eyes:  Negative for dryness.  Respiratory:  Negative for shortness of breath.   Cardiovascular:  Negative for chest pain and palpitations.  Gastrointestinal:  Negative for blood in stool, constipation and diarrhea.  Endocrine: Negative for increased urination.  Genitourinary:  Negative for involuntary urination.  Musculoskeletal:  Positive for joint pain, joint pain and morning stiffness. Negative for gait problem, joint swelling, myalgias, muscle weakness, muscle tenderness and myalgias.  Skin:  Negative for color change, rash, hair loss and sensitivity to sunlight.  Allergic/Immunologic: Negative for susceptible to infections.  Neurological:  Positive for headaches. Negative for dizziness.  Hematological:  Negative for swollen  glands.  Psychiatric/Behavioral:  Negative for depressed mood and sleep disturbance. The patient is not nervous/anxious.     PMFS History:  Patient Active Problem List   Diagnosis Date Noted   Renal cyst 07/14/2021   ANA positive 05/19/2017   Family history of lupus erythematosus 05/19/2017   History of hypertension 05/19/2017   History of hypercholesterolemia 05/19/2017   History of IBS 05/19/2017   History of migraine 05/19/2017   SVT (supraventricular tachycardia) 05/19/2017   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[place]) 05/19/2017   Primary osteoarthritis of both knees 10/21/2016   Other psoriasis 10/21/2016   Pain in joints of both feet 10/21/2016   S/P hysterectomy 08/22/2013   RLQ abdominal pain 02/25/2012   Ovarian torsion 02/25/2012    Past Medical History:  Diagnosis Date   DVT (deep venous thrombosis) ([place])    age 41   Headache(784.0)    Hyperlipidemia 2008   diet controlled   Hypertension    Peripheral vascular disease ([place]) 2008   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   PONV (postoperative nausea and vomiting)    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[facility])    Thrombophilia   Tachycardia 2008   with the PE    Family History  Problem Relation Age of Onset   Parkinsonism Mother    Heart attack Mother    Heart attack Father    Thyroid cancer Sister    Healthy Son    Healthy Son    Past Surgical History:  Procedure Laterality Date   APPENDECTOMY  2013   BREAST REDUCTION SURGERY  07/26/1998  [place]  02/24/2012   Procedure: LAPAROSCOPY OPERATIVE;  Surgeon: Marvene Staff, MD;  Location: [facility];  Service: Gynecology;  Laterality: Right;  with Lyses of Adhesions   RENAL CYST EXCISION     06/2021   ROBOTIC ASSISTED TOTAL HYSTERECTOMY N/A 08/22/2013   Procedure: ROBOTIC ASSISTED TOTAL HYSTERECTOMY/BILATERAL SALPINGECTOMY;  Surgeon: Marvene Staff, MD;  Location: [facility];  Service: Gynecology;  Laterality: N/A;    SALPINGOOPHORECTOMY  02/24/2012   Procedure: SALPINGO OOPHERECTOMY;  Surgeon: Marvene Staff, MD;  Location: [facility];  Service: Gynecology;  Laterality: Right;  Right Salpingo-Oophorectomy; Left Salpingectomy   TONSILLECTOMY     age 85   [place]   Social History   Social History Narrative   Not on file    There is no immunization history on file for this patient.   Objective: Vital Signs: BP 132/85 (BP Location: Left Arm, Patient Position: Sitting, Cuff Size: Normal)   Pulse 63   Resp 16   Ht 5' 4"  (1.626 m)   Wt 183 lb 12.8 oz (83.4 kg)   LMP 08/10/2013 Comment: ablation  BMI 31.55 kg/m    Physical Exam Vitals and nursing note reviewed.  Constitutional:      Appearance: She is well-developed.  HENT:     Head: Normocephalic and atraumatic.  Eyes:     Conjunctiva/sclera: Conjunctivae normal.  Cardiovascular:     Rate and Rhythm: Normal rate and regular rhythm.     Heart sounds: Normal heart sounds.  Pulmonary:     Effort: Pulmonary effort is normal.     Breath sounds: Normal breath sounds.  Abdominal:     General: Bowel sounds are normal.     Palpations: Abdomen is soft.  Musculoskeletal:     Cervical back: Normal range of motion.  Skin:    General: Skin is warm and dry.     Capillary Refill: Capillary refill takes less than 2 seconds.  Neurological:     Mental Status: She is alert and oriented to person, place, and time.  Psychiatric:        Behavior: Behavior normal.      Musculoskeletal Exam: C-spine has good range of motion.  Thoracic spine has limited range of motion.  Midline spinal tenderness in the thoracic region.  No SI joint tenderness upon palpation today.  Shoulder joints, elbow joints, wrist joints, MCPs, PIPs, DIPs have good range of motion with no synovitis.  Complete fist formation bilaterally.  Hip joints have slightly limited range of motion especially the left hip.  Knee joints have good range of motion with no warmth or  effusion.  Ankle joints have good range of motion with no joint tenderness or synovitis.  Prominence at Achilles tendon insertion site especially on the right side.  No tenderness or synovitis over MTP joints.  CDAI Exam: CDAI Score: -- Patient Global: --; Provider Global: -- Swollen: --; Tender: -- Joint Exam 05/11/2022   No joint exam has been documented for this visit   There is currently no information documented on the homunculus. Go to the Rheumatology activity and complete the homunculus joint exam.  Investigation: No additional findings.  Imaging: XR Foot 2 Views Left  Result Date: 04/27/2022 First MTP, PIP and DIP narrowing was noted.  Dorsal spurring was noted.  No tibiotalar or subtalar joint space narrowing was noted.  Large inferior and posterior calcaneal spurs were noted.  Calcification of plantar fascia was noted. Impression: These findings are consistent with osteoarthritis of the foot.  XR Foot 2 Views Right  Result Date: 04/27/2022 First MTP, PIP and DIP narrowing was noted.  Intertarsal narrowing was noted.  Inferior posterior calcaneal spurs were noted.  No tibiotalar joint space narrowing was noted.  No erosive changes were noted. Impression: These findings are consistent with osteoarthritis of the foot.  XR Pelvis 1-2 Views  Result Date: 04/27/2022 No SI joint sclerosis was noted.  No significant SI joint narrowing was noted.  Spurring of the acetabulum was noted.  Degenerative changes were noted in the lumbar spine. Impression: No SI joint sclerosis was noted.  Degenerative changes were noted in the hip joints and lumbar spine.  XR Lumbar Spine 2-3 Views  Result Date: 04/27/2022 Anterior osteophytes were noted.  No syndesmophytes were noted.  No significant disc space narrowing was noted.  Multilevel spondylosis with facet joint arthropathy was noted. Impression: These findings are consistent with degenerative disc disease of the thoracic spine and facet joint  arthropathy.  XR Thoracic Spine 2 View  Result Date: 04/27/2022 Multilevel spondylosis with anterior and lateral osteophytes was noted.  No syndesmophytes were noted. Impression: These findings are consistent with degenerative disc disease of the thoracic spine.   Recent Labs: Lab Results  Component Value Date   WBC 7.2 07/07/2021   HGB 14.2 07/15/2021   PLT 265 07/07/2021   NA 136 07/15/2021   K 4.2 07/15/2021   CL 101 07/15/2021   CO2 25 07/15/2021   GLUCOSE 190 (H) 07/15/2021   BUN 15 07/15/2021   CREATININE 0.83 07/15/2021   BILITOT 0.5 05/22/2018   ALKPHOS 72 11/22/2017   AST 24 05/22/2018   ALT 41 (H) 05/22/2018   PROT 7.2 05/22/2018   ALBUMIN 3.7 11/22/2017   CALCIUM 8.8 (L) 07/15/2021   GFRAA 75 05/22/2018    Speciality Comments: No specialty comments available.  Procedures:  No procedures performed Allergies: Morphine and related   Assessment / Plan:     Visit Diagnoses: Polyarthralgia: Lab work from 04/27/2022 was reviewed today in the office: ANA: 1: 40 nuclear, fine speckled, anti-CCP negative, RF negative, CK within normal limits, ESR within normal limits, CRP within normal limits, and HLA-B27 negative. X-rays of the thoracic spine, lumbar spine, pelvis, and both feet were reviewed today in detail. Discussed that x-ray results of her thoracic spine are consistent with diffuse idiopathic skeletal hyperostosis (DISH) and I would recommend a referral to physical therapy.  She is not a good candidate for NSAID use due to long-term Eliquis use. She has no signs of active inflammatory arthritis at this time.  No synovitis or dactylitis was noted.  Patient continues to have symptoms consistent with Achilles tendinitis bilaterally.  She has tried conservative treatment options including orthotics, bracing, activity modification, and physical therapy in the past.  MRI of the right ankle from 03/19/2022 was consistent with Achilles tendinitis and plantar fasciitis.  Patient  was encouraged to continue home exercises and to wear proper fitting shoes.   The plan is for the patient to initiate physical therapy for the pain and stiffness she is experiencing in the thoracic and lumbar spine and follow back up in 2 months.  Briefly discussed that if she develops active inflammation she may benefit from a trial of sulfasalazine in the future.  She will notify us if she develops any new or worsening symptoms.  DISH (diffuse idiopathic skeletal hyperostosis): Reviewed x-ray results including the thoracic  spine, lumbar spine, and pelvis from 04/27/2022.  Findings in the thoracic spine were consistent with DISH.  Discussed the diagnosis in detail as well as treatment options.  She is unable to take NSAIDs due to being on Eliquis long-term.  Discussed a referral to physical therapy and she was in agreement.  [facility]erral to [facility] was placed today.- Plan: Ambulatory referral to Physical Therapy  Positive ANA (antinuclear antibody): ANA 1: 80 nuclear, fine speckled.  No clinical features of systemic lupus at this time.  We can recheck ANA and ENA in 1 year or sooner if she develops any new or worsening symptoms.  Primary osteoarthritis of both knees: She has good range of motion of both knee joints on examination today.  No warmth or effusion was noted.  Psoriasis - Not confirmed by dermatology.  Patient has never had a skin biopsy.  No clinical features of active psoriasis at this time. HLA-B27 negative.  Trochanteric bursitis of left hip: She has tenderness ovation over bilateral trochanteric bursa.  DDD (degenerative disc disease), thoracic -x-rays of the thoracic spine were obtained on 04/27/2022 which were consistent with generative disc disease.  Anterior lateral osteophytes were noted.  Features were consistent with DISH.  A referral to physical therapy was placed today.  Plan: Ambulatory referral to Physical Therapy  Limited active range of motion (AROM) of thoracic  spine on rotation - X-rays consistent with degenerative disc disease and DISH.  A referral to physical therapy was placed today.- Plan: Ambulatory referral to Physical Therapy  DDD (degenerative disc disease), lumbar: HLA-B27 negative: ESR and CRP were within normal limits on 04/27/22.   X-rays of the lumbar spine were consistent with multilevel spondylosis and facet joint arthropathy.  Referral to physical therapy was placed today.  Primary osteoarthritis of both feet:RF- Anti-CCP-, ESR and CRP WNL: HLA-B27 negative.  X-ray findings from 04/27/2022 were consistent with osteoarthritis.  No erosive changes were noted.  Large inferior and posterior cannula spur noted bilaterally, left > right.  Patient has been under the care of Dr. Doran Durand for the past 2 years.  She has been treated for recurrent Achilles tendinitis and plantar fasciitis.  She has tried conservative treatment including orthotics, bracing, activity modification, and physical therapy in the past.  She has continued home exercises as advised. MRI of right ankle 03/19/2022 was consistent with: Moderate tendinosis with insertional splitting and tendinosis edema at the insertion of the Achilles tendon.  Mild tendinosis with low-grade interstitial splitting of the peroneal longus tendon.  Moderate plantar fasciitis at the central band plantar fascia.  Mild to moderate osteoarthritis of the second through fourth tarsometatarsal joints.  On examination she has some tenderness and prominence over the Achilles tendon insertion site bilaterally, right greater than left.  No tenderness along the plantar fascia at this time.  No synovitis or dactylitis noted. If she develops increased joint pain or joint swelling we could consider scheduling an ultrasound of both feet to assess for synovitis. Discussed the option of trying a trial of sulfasalazine in the future if her symptoms persist or worsen.  Other medical conditions are listed as follows:  History of  insomnia  History of hypercholesterolemia  History of IBS  Hx of migraines  History of hypertension: Blood pressure is 132/85 today in the office.  History of cholecystectomy  SVT (supraventricular tachycardia)  History of appendectomy  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[facility])  Family history of lupus erythematosus  Orders: Orders Placed This Encounter  Procedures   Ambulatory  referral to Physical Therapy   No orders of the defined types were placed in this encounter.    Follow-Up Instructions: Return in about 3 months (around 08/11/2022).   Ofilia Neas, PA-C  Note - This record has been created using Dragon software.  Chart creation errors have been sought, but may not always  have been located. Such creation errors do not reflect on  the standard of medical care.

## 2022-07-21 DIAGNOSIS — K621 Rectal polyp: Secondary | ICD-10-CM | POA: Diagnosis not present

## 2022-07-21 DIAGNOSIS — D128 Benign neoplasm of rectum: Secondary | ICD-10-CM | POA: Diagnosis not present

## 2022-07-21 DIAGNOSIS — Z1211 Encounter for screening for malignant neoplasm of colon: Secondary | ICD-10-CM | POA: Diagnosis not present

## 2022-08-02 NOTE — Progress Notes (Signed)
Office Visit Note  Patient: Angela Washington             Date of Birth: [birth year]           MRN: [id number]             PCP: Kennieth Rad, MD Referring: Kennieth Rad, MD Visit Date: 08/16/2022 Occupation: '@GUAROCC'$ @  Subjective:  Thoracic spinal pain   History of Present Illness: KAYDANCE BOWIE is a 57 y.o. female with history of DISH, positive ANA, and osteoarthritis.  Patient was last seen in the office on 05/11/2022.  She was referred to physical therapy for management of DISH at that time but was unable to initiate physical therapy due to other things going on in her life.  She states that she is open to a referral to physical therapy in [place] at this point.  She states she continues to have persistent discomfort in the thoracic spine.  She states that her pain is worse with rotation of the spine and after working for a prolonged period of time.  She has been using biofreeze and arnica topically.  She is limited on OTC products for pain relief due to long term eliquis use.  She remains during the care of Dr. Doran Durand for management of Achilles tendinitis and plantar fasciitis bilaterally.  She has tried physical therapy in the past as well as orthotics, bracing, and activity modification.  She is not ready to proceed with surgery at this time.  She plans on restarting physical therapy. She denies any other joint pain or joint swelling at this time.  She denies any recent rashes.  She denies any new medical conditions.  Activities of Daily Living:  Patient reports morning stiffness for 1 hour.   Patient Reports nocturnal pain.  Difficulty dressing/grooming: Reports Difficulty climbing stairs: Denies Difficulty getting out of chair: Denies Difficulty using hands for taps, buttons, cutlery, and/or writing: Denies  Review of Systems  Constitutional:  Positive for fatigue.  HENT:  Negative for mouth sores and mouth dryness.   Eyes:  Negative for dryness.  Respiratory:  Negative for  shortness of breath.   Cardiovascular:  Negative for chest pain and palpitations.  Gastrointestinal:  Negative for blood in stool, constipation and diarrhea.  Endocrine: Negative for increased urination.  Genitourinary:  Negative for involuntary urination.  Musculoskeletal:  Positive for joint pain, joint pain and morning stiffness. Negative for gait problem, joint swelling, myalgias, muscle weakness, muscle tenderness and myalgias.  Skin:  Negative for color change, rash, hair loss and sensitivity to sunlight.  Allergic/Immunologic: Negative for susceptible to infections.  Neurological:  Negative for dizziness and headaches.  Hematological:  Negative for swollen glands.  Psychiatric/Behavioral:  Negative for depressed mood and sleep disturbance. The patient is not nervous/anxious.     PMFS History:  Patient Active Problem List   Diagnosis Date Noted   Renal cyst 07/14/2021   ANA positive 05/19/2017   Family history of lupus erythematosus 05/19/2017   History of hypertension 05/19/2017   History of hypercholesterolemia 05/19/2017   History of IBS 05/19/2017   History of migraine 05/19/2017   SVT (supraventricular tachycardia) 05/19/2017   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[place]) 05/19/2017   Primary osteoarthritis of both knees 10/21/2016   Other psoriasis 10/21/2016   Pain in joints of both feet 10/21/2016   S/P hysterectomy 08/22/2013   RLQ abdominal pain 02/25/2012   Ovarian torsion 02/25/2012    Past Medical History:  Diagnosis Date   DVT (deep venous thrombosis) (  [facility])    age 54   Headache(784.0)    Hyperlipidemia 2008   diet controlled   Hypertension    Peripheral vascular disease ([place]) 2008   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   PONV (postoperative nausea and vomiting)    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[place])    Thrombophilia   Tachycardia 2008   with the PE    Family History  Problem Relation Age of Onset   Parkinsonism Mother    Heart attack Mother    Heart attack Father     Thyroid cancer Sister    Healthy Son    Healthy Son    Past Surgical History:  Procedure Laterality Date   APPENDECTOMY  2013   BREAST REDUCTION SURGERY  07/26/1998   CESAREAN SECTION  1994, 1998   [place]  02/24/2012   Procedure: LAPAROSCOPY OPERATIVE;  Surgeon: Marvene Staff, MD;  Location: [facility];  Service: Gynecology;  Laterality: Right;  with Lyses of Adhesions   RENAL CYST EXCISION     06/2021   ROBOTIC ASSISTED TOTAL HYSTERECTOMY N/A 08/22/2013   Procedure: ROBOTIC ASSISTED TOTAL HYSTERECTOMY/BILATERAL SALPINGECTOMY;  Surgeon: Marvene Staff, MD;  Location: [facility];  Service: Gynecology;  Laterality: N/A;   SALPINGOOPHORECTOMY  02/24/2012   Procedure: SALPINGO OOPHERECTOMY;  Surgeon: Marvene Staff, MD;  Location: [facility];  Service: Gynecology;  Laterality: Right;  Right Salpingo-Oophorectomy; Left Salpingectomy   TONSILLECTOMY     age 32   [place]   Social History   Social History Narrative   Not on file    There is no immunization history on file for this patient.   Objective: Vital Signs: BP 139/83 (BP Location: Left Arm, Patient Position: Sitting, Cuff Size: Normal)   Pulse 73   Resp 16   Ht '5\' 4"'$  (1.626 m)   Wt 184 lb (83.5 kg)   LMP 08/10/2013 Comment: ablation  BMI 31.58 kg/m    Physical Exam Vitals and nursing note reviewed.  Constitutional:      Appearance: She is well-developed.  HENT:     Head: Normocephalic and atraumatic.  Eyes:     Conjunctiva/sclera: Conjunctivae normal.  Cardiovascular:     Rate and Rhythm: Normal rate and regular rhythm.     Heart sounds: Normal heart sounds.  Pulmonary:     Effort: Pulmonary effort is normal.     Breath sounds: Normal breath sounds.  Abdominal:     General: Bowel sounds are normal.     Palpations: Abdomen is soft.  Musculoskeletal:     Cervical back: Normal range of motion.  Lymphadenopathy:     Cervical: No cervical adenopathy.   Skin:    General: Skin is warm and dry.     Capillary Refill: Capillary refill takes less than 2 seconds.  Neurological:     Mental Status: She is alert and oriented to person, place, and time.  Psychiatric:        Behavior: Behavior normal.      Musculoskeletal Exam: C-spine has good ROM.  Midline spinal tenderness in the thoracic region. No midline spinal tenderness in the lumbar region or tenderness over the SI joints. Shoulder joints, elbow joints, wrist joints, MCPs, PIPs, and DIPs good ROM with no synovitis.  Complete fist formation bilaterally.  DIP prominence.  Hip joints have good ROM with no groin pain.  Knee joints have good ROM with no warmth or effusion.    CDAI Exam:  CDAI Score: -- Patient Global: --; Provider Global: -- Swollen: --; Tender: -- Joint Exam 08/16/2022   No joint exam has been documented for this visit   There is currently no information documented on the homunculus. Go to the Rheumatology activity and complete the homunculus joint exam.  Investigation: No additional findings.  Imaging: No results found.  Recent Labs: Lab Results  Component Value Date   WBC 7.2 07/07/2021   HGB 14.2 07/15/2021   PLT 265 07/07/2021   NA 136 07/15/2021   K 4.2 07/15/2021   CL 101 07/15/2021   CO2 25 07/15/2021   GLUCOSE 190 (H) 07/15/2021   BUN 15 07/15/2021   CREATININE 0.83 07/15/2021   BILITOT 0.5 05/22/2018   ALKPHOS 72 11/22/2017   AST 24 05/22/2018   ALT 41 (H) 05/22/2018   PROT 7.2 05/22/2018   ALBUMIN 3.7 11/22/2017   CALCIUM 8.8 (L) 07/15/2021   GFRAA 75 05/22/2018    Speciality Comments: No specialty comments available.  Procedures:  No procedures performed Allergies: Morphine and related   Assessment / Plan:     Visit Diagnoses: Polyarthralgia - 04/27/2022: ANA: 1:80 nuclear, fine speckled, anti-CCP negative, RF negative, CK within normal limits, ESR WNL, CRP within normal limits, and HLA-B27 negative: She has no synovitis or dactylitis  on examination today. She was advised to notify us if she develops any new or worsening symptoms.    DISH (diffuse idiopathic skeletal hyperostosis) -  X-ray results of her thoracic spine are consistent with diffuse idiopathic skeletal hyperostosis (DISH). A referral to physical therapy at [facility] was placed after her last office visit but the referral was closed due to the patient not returning their call to schedule.  She is open to a referral to physical therapy now but would like the referral placed in [place] which will be more convenient for her. Patient declined a prescription for a muscle relaxer at this time.  She was advised to notify us if her symptoms persist or worsen or if she starts having more frequent muscle spasms with physical therapy at which time we can send a prescription for methocarbamol 500 mg 1 tablet by mouth daily as needed for muscle spasms. She was advised to notify us if she develops any new or worsening symptoms.  DDD (degenerative disc disease), thoracic - X-rays of the thoracic spine were obtained on 04/27/2022 which were consistent with degenerative disc disease. Referral to PT placed today.   Limited active range of motion (AROM) of thoracic spine on rotation - X-rays consistent with degenerative disc disease and DISH.  Referral to physical therapy was placed today.  Patient declined a prescription for muscle relaxer.  She cannot take NSAIDs due to current use of Eliquis.    Positive ANA (antinuclear antibody) - ANA 1: 80 nuclear, fine speckled.  No clinical features of systemic lupus at this time.    Primary osteoarthritis of both knees: She has good ROM of both knee joints with no warmth or effusion.    Psoriasis - Not confirmed by dermatology.  Patient has never had a skin biopsy.  No clinical features of active psoriasis at this time.HLA-B27 negative.  No active lesions at this time.   Trochanteric bursitis of left hip: No tenderness over the left  trochanteric bursa at this time.   DDD (degenerative disc disease), lumbar - HLA-B27 negative: ESR and CRP within normal limits 04/27/22. X-rays of the lumbar spine were consistent with multilevel spondylosis and facet joint arthropathy.  No  midlines spinal tenderness.  No symptoms of radiculopathy.    Primary osteoarthritis of both feet - RF- Anti-CCP-, ESR and CRP WNL: HLA-B27 negative.  X-ray findings from 04/27/2022 were consistent with osteoarthritis.  No joint swelling.  She remains under the care of Dr. Doran Durand for management of achilles tendonitis and plantar fasciitis.  She has tried conservative treatment including orthotics, bracing, activity modification, and physical therapy in the past. She is not ready to proceed with surgery at this time.  She plans on restarting PT.   Other medical conditions are listed as follows:   History of insomnia  History of IBS  History of hypercholesterolemia  Hx of migraines  History of cholecystectomy  History of hypertension: BP was 139/83 today in the office.   History of appendectomy  SVT (supraventricular tachycardia)  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HCC)  Family history of lupus erythematosus  Orders: No orders of the defined types were placed in this encounter.  No orders of the defined types were placed in this encounter.    Follow-Up Instructions: Return in about 5 months (around 01/15/2023) for DISH, +ANA, Osteoarthritis.   Ofilia Neas, PA-C  Note - This record has been created using Dragon software.  Chart creation errors have been sought, but may not always  have been located. Such creation errors do not reflect on  the standard of medical care.

## 2022-08-16 ENCOUNTER — Encounter: Payer: Self-pay | Admitting: Physician Assistant

## 2022-08-16 ENCOUNTER — Ambulatory Visit: Payer: BC Managed Care – PPO | Attending: Physician Assistant | Admitting: Physician Assistant

## 2022-08-16 VITALS — BP 139/83 | HR 73 | Resp 16 | Ht 64.0 in | Wt 184.0 lb

## 2022-08-16 DIAGNOSIS — R768 Other specified abnormal immunological findings in serum: Secondary | ICD-10-CM | POA: Diagnosis not present

## 2022-08-16 DIAGNOSIS — M5134 Other intervertebral disc degeneration, thoracic region: Secondary | ICD-10-CM

## 2022-08-16 DIAGNOSIS — M5384 Other specified dorsopathies, thoracic region: Secondary | ICD-10-CM

## 2022-08-16 DIAGNOSIS — M170 Bilateral primary osteoarthritis of knee: Secondary | ICD-10-CM | POA: Diagnosis not present

## 2022-08-16 DIAGNOSIS — Z8679 Personal history of other diseases of the circulatory system: Secondary | ICD-10-CM

## 2022-08-16 DIAGNOSIS — D6852 Prothrombin gene mutation: Secondary | ICD-10-CM

## 2022-08-16 DIAGNOSIS — I471 Supraventricular tachycardia, unspecified: Secondary | ICD-10-CM

## 2022-08-16 DIAGNOSIS — M2550 Pain in unspecified joint: Secondary | ICD-10-CM

## 2022-08-16 DIAGNOSIS — M19072 Primary osteoarthritis, left ankle and foot: Secondary | ICD-10-CM

## 2022-08-16 DIAGNOSIS — Z9049 Acquired absence of other specified parts of digestive tract: Secondary | ICD-10-CM

## 2022-08-16 DIAGNOSIS — Z8639 Personal history of other endocrine, nutritional and metabolic disease: Secondary | ICD-10-CM

## 2022-08-16 DIAGNOSIS — M5136 Other intervertebral disc degeneration, lumbar region: Secondary | ICD-10-CM

## 2022-08-16 DIAGNOSIS — Z8719 Personal history of other diseases of the digestive system: Secondary | ICD-10-CM

## 2022-08-16 DIAGNOSIS — M4810 Ankylosing hyperostosis [Forestier], site unspecified: Secondary | ICD-10-CM | POA: Diagnosis not present

## 2022-08-16 DIAGNOSIS — M7062 Trochanteric bursitis, left hip: Secondary | ICD-10-CM

## 2022-08-16 DIAGNOSIS — M19071 Primary osteoarthritis, right ankle and foot: Secondary | ICD-10-CM

## 2022-08-16 DIAGNOSIS — L409 Psoriasis, unspecified: Secondary | ICD-10-CM

## 2022-08-16 DIAGNOSIS — Z8669 Personal history of other diseases of the nervous system and sense organs: Secondary | ICD-10-CM

## 2022-08-16 DIAGNOSIS — Z840 Family history of diseases of the skin and subcutaneous tissue: Secondary | ICD-10-CM

## 2022-08-16 DIAGNOSIS — Z87898 Personal history of other specified conditions: Secondary | ICD-10-CM

## 2022-08-16 NOTE — Addendum Note (Signed)
Addended by: Earnestine Mealing on: 08/16/2022 10:16 AM   Modules accepted: Orders

## 2022-09-10 ENCOUNTER — Ambulatory Visit (HOSPITAL_COMMUNITY): Payer: BC Managed Care – PPO

## 2022-09-20 ENCOUNTER — Ambulatory Visit (HOSPITAL_COMMUNITY): Payer: BC Managed Care – PPO | Attending: Physician Assistant | Admitting: Physical Therapy

## 2022-09-20 DIAGNOSIS — M546 Pain in thoracic spine: Secondary | ICD-10-CM | POA: Diagnosis not present

## 2022-09-20 DIAGNOSIS — M4810 Ankylosing hyperostosis [Forestier], site unspecified: Secondary | ICD-10-CM | POA: Diagnosis not present

## 2022-09-20 NOTE — Therapy (Signed)
OUTPATIENT PHYSICAL THERAPY THORACOLUMBAR EVALUATION   Patient Name: Angela Washington MRN: [id number] DOB:[birth year], 57 y.o., female Today's Date: 09/20/2022  END OF SESSION:  PT End of Session - 09/20/22 1518     Visit Number 1    Number of Visits 8    Date for PT Re-Evaluation 10/18/22    Authorization Type BCBS PPO    PT Start Time 1434    PT Stop Time 1520    PT Time Calculation (min) 46 min    Activity Tolerance Patient tolerated treatment well    Behavior During Therapy [facility] for tasks assessed/performed             Past Medical History:  Diagnosis Date   DVT (deep venous thrombosis) ([place])    age 63   Headache(784.0)    Hyperlipidemia 2008   diet controlled   Hypertension    Peripheral vascular disease ([place]) 2008   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   PONV (postoperative nausea and vomiting)    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[place])    Thrombophilia   Tachycardia 2008   with the PE   Past Surgical History:  Procedure Laterality Date   APPENDECTOMY  2013   BREAST REDUCTION SURGERY  07/26/1998   CESAREAN SECTION  1994, 1998   [place]   LAPAROSCOPY  02/24/2012   Procedure: LAPAROSCOPY OPERATIVE;  Surgeon: Marvene Staff, MD;  Location: [facility];  Service: Gynecology;  Laterality: Right;  with Lyses of Adhesions   RENAL CYST EXCISION     06/2021   ROBOTIC ASSISTED TOTAL HYSTERECTOMY N/A 08/22/2013   Procedure: ROBOTIC ASSISTED TOTAL HYSTERECTOMY/BILATERAL SALPINGECTOMY;  Surgeon: Marvene Staff, MD;  Location: [facility];  Service: Gynecology;  Laterality: N/A;   SALPINGOOPHORECTOMY  02/24/2012   Procedure: SALPINGO OOPHERECTOMY;  Surgeon: Marvene Staff, MD;  Location: [facility];  Service: Gynecology;  Laterality: Right;  Right Salpingo-Oophorectomy; Left Salpingectomy   TONSILLECTOMY     age 54   [place]   Patient Active Problem List   Diagnosis Date Noted   Renal cyst 07/14/2021   ANA positive 05/19/2017   Family history of  lupus erythematosus 05/19/2017   History of hypertension 05/19/2017   History of hypercholesterolemia 05/19/2017   History of IBS 05/19/2017   History of migraine 05/19/2017   SVT (supraventricular tachycardia) 05/19/2017   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[place]) 05/19/2017   Primary osteoarthritis of both knees 10/21/2016   Other psoriasis 10/21/2016   Pain in joints of both feet 10/21/2016   S/P hysterectomy 08/22/2013   RLQ abdominal pain 02/25/2012   Ovarian torsion 02/25/2012    PCP: Kennieth Rad MD   REFERRING PROVIDER: Ofilia Neas, PA-C  REFERRING DIAG: M48.10 (ICD-10-CM) - DISH (diffuse idiopathic skeletal hyperostosis)  Rationale for Evaluation and Treatment: Rehabilitation  THERAPY DIAG:  Pain in thoracic spine - Plan: PT plan of care cert/re-cert  ONSET DATE: About 2 years   SUBJECTIVE:  SUBJECTIVE STATEMENT: Patient presents to therapy with complaint of RT thoracic pain. Pain began about 2 years ago insidiously. Initially she thought it had to do with her kidney, but later had a cyst removal from RT kidney with lateral/ rib pain, but back pain remained. She denies taking medication for this. They have ruled out kidney stone. She has had some rheumatoid issues in the past and has been diagnosed with DISH.   PERTINENT HISTORY:  Bone spurs, kidney cysts   PAIN:  Are you having pain? Yes: NPRS scale: (worst) 7-8/10; (average) 2-3/10 Pain location: Rt posterior thoracic spine (T9-10 area)  Pain description: constant, dull aching, sharp  Aggravating factors: prolonged sitting, twisting, rolling over in bed  Relieving factors: heating pad, changing positions   PRECAUTIONS: None  WEIGHT BEARING RESTRICTIONS: No  FALLS:  Has patient fallen in last 6 months? No  LIVING ENVIRONMENT: Lives  with: lives with their spouse Lives in: House/apartment  OCCUPATION: Midwife   PLOF: Independent  PATIENT GOALS: Be able to move without hurting   NEXT MD VISIT: Mid summer   OBJECTIVE:   DIAGNOSTIC FINDINGS:  findings are consistent with degenerative disc disease  of the thoracic spine and facet joint arthropathy.  findings are consistent with degenerative disc disease  of the thoracic spine.  SCREENING FOR RED FLAGS: Bowel or bladder incontinence: No Spinal tumors: No Cauda equina syndrome: No Compression fracture: No Abdominal aneurysm: No  COGNITION: Overall cognitive status: Within functional limits for tasks assessed     SENSATION: WFL  PALPATION: Min/ mod TTP about RT thoracic paraspinals   LUMBAR ROM:   AROM eval  Flexion Full  Extension Full  Right lateral flexion 80% limited  Left lateral flexion 25% limited   Right rotation   Left rotation    (Blank rows = not tested)  LOWER EXTREMITY ROM:     Bilateral UE AROM WFL Min restriction in thoracic flexion Severe restriction in RT thoracic rotation Mod restriction in LT thoracic rotation  LOWER EXTREMITY MMT:    UE MMT 4+/5 grossly throughout   TODAY'S TREATMENT:                                                                                                                              DATE:  09/20/22  Eval   PATIENT EDUCATION:  Education details: on Eval finding, POC and HEP  Person educated: Patient Education method: Explanation Education comprehension: verbalized understanding  HOME EXERCISE PROGRAM: Access Code: [email or web address] URL: [email or web address] Date: 09/20/2022 Prepared by: Josue Hector  Exercises - [place]  - 2 x daily - 7 x weekly - 1 sets - 10 reps - 5 second hold - Seated Scapular Retraction  - 2 x daily - 7 x weekly - 1 sets - 10 reps - 5 second hold  ASSESSMENT:  CLINICAL IMPRESSION: Patient is a 57 y.o. female who presents to physical  therapy with complaint of thoracic spine pain. Patient demonstrates  reduced ROM, and fascial restrictions which are likely contributing to symptoms of pain and are negatively impacting patient ability to perform ADLs and functional mobility tasks. Patient will benefit from skilled physical therapy services to address these deficits to reduce pain and improve level of function with ADLs and functional mobility tasks.   OBJECTIVE IMPAIRMENTS: decreased activity tolerance, decreased mobility, decreased ROM, hypomobility, increased fascial restrictions, impaired flexibility, impaired UE functional use, and pain.   ACTIVITY LIMITATIONS: carrying, lifting, bending, sitting, standing, squatting, sleeping, bed mobility, and hygiene/grooming  PARTICIPATION LIMITATIONS: meal prep, cleaning, laundry, shopping, community activity, occupation, and yard work  PERSONAL FACTORS:  NA  are also affecting patient's functional outcome.   REHAB POTENTIAL: Good  CLINICAL DECISION MAKING: Stable/uncomplicated  EVALUATION COMPLEXITY: Low   GOALS: SHORT TERM GOALS: Target date: 10/04/2022  Patient will be independent with initial HEP and self-management strategies to improve functional outcomes Baseline:  Goal status: INITIAL   LONG TERM GOALS: Target date: 10/18/2022  Patient will be independent with advanced HEP and self-management strategies to improve functional outcomes Baseline:  Goal status: INITIAL  2.  Patient will report at least 50% overall improvement in subjective complaint to indicate improvement in ability to perform ADLs. Baseline:  Goal status: INITIAL  3.  Patient will demo improved RT thoracic rotation by at least 50% in order to improve ability to scan environment for safety and while driving. Baseline: Severe restriction/ 80% limited Goal status: INITIAL  4. Patient will report a decrease in thoracic pain to no more than 3/10 with movement for improved quality of life and ability to  perform UE ADLs  Baseline: 7-8/10 Goal status: INITIAL PLAN:  PT FREQUENCY: 1-2x/week  PT DURATION: 4 weeks  PLANNED INTERVENTIONS: Therapeutic exercises, Therapeutic activity, Neuromuscular re-education, Balance training, Gait training, Patient/Family education, Joint manipulation, Joint mobilization, Stair training, Aquatic Therapy, Dry Needling, Electrical stimulation, Spinal manipulation, Spinal mobilization, Cryotherapy, Moist heat, scar mobilization, Taping, Traction, Ultrasound, Biofeedback, Ionotophoresis '4mg'$ /ml Dexamethasone, and Manual therapy. Marland Kitchen  PLAN FOR NEXT SESSION: Progress thoracic mobility as tolerated. Postural/ scapular strengthening. T spine mobs and rib mobs  3:21 PM, 09/20/22 Josue Hector PT DPT  Physical Therapist with [facility]  [phone]

## 2022-09-29 ENCOUNTER — Ambulatory Visit (HOSPITAL_COMMUNITY): Payer: BC Managed Care – PPO | Attending: Physician Assistant | Admitting: Physical Therapy

## 2022-09-29 DIAGNOSIS — M546 Pain in thoracic spine: Secondary | ICD-10-CM | POA: Diagnosis not present

## 2022-09-29 NOTE — Therapy (Signed)
OUTPATIENT PHYSICAL THERAPY TREATMENT   Patient Name: Angela Washington MRN: [id number] DOB:[birth year], 57 y.o., female Today's Date: 09/29/2022  END OF SESSION:  PT End of Session - 09/29/22 1656     Visit Number 2    Number of Visits 8    Date for PT Re-Evaluation 10/18/22    Authorization Type BCBS PPO    PT Start Time 1651    PT Stop Time [id number]    PT Time Calculation (min) 38 min    Activity Tolerance Patient tolerated treatment well    Behavior During Therapy [facility] for tasks assessed/performed             Past Medical History:  Diagnosis Date   DVT (deep venous thrombosis) ([place])    age 53   Headache(784.0)    Hyperlipidemia 2008   diet controlled   Hypertension    Peripheral vascular disease ([place]) 2008   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   PONV (postoperative nausea and vomiting)    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[place])    Thrombophilia   Tachycardia 2008   with the PE   Past Surgical History:  Procedure Laterality Date   APPENDECTOMY  2013   BREAST REDUCTION SURGERY  07/26/1998   CESAREAN SECTION  1994, 1998   [place]   LAPAROSCOPY  02/24/2012   Procedure: LAPAROSCOPY OPERATIVE;  Surgeon: Marvene Staff, MD;  Location: [facility];  Service: Gynecology;  Laterality: Right;  with Lyses of Adhesions   RENAL CYST EXCISION     06/2021   ROBOTIC ASSISTED TOTAL HYSTERECTOMY N/A 08/22/2013   Procedure: ROBOTIC ASSISTED TOTAL HYSTERECTOMY/BILATERAL SALPINGECTOMY;  Surgeon: Marvene Staff, MD;  Location: [facility];  Service: Gynecology;  Laterality: N/A;   SALPINGOOPHORECTOMY  02/24/2012   Procedure: SALPINGO OOPHERECTOMY;  Surgeon: Marvene Staff, MD;  Location: [facility];  Service: Gynecology;  Laterality: Right;  Right Salpingo-Oophorectomy; Left Salpingectomy   TONSILLECTOMY     age 33   [place]   Patient Active Problem List   Diagnosis Date Noted   Renal cyst 07/14/2021   ANA positive 05/19/2017   Family history of lupus  erythematosus 05/19/2017   History of hypertension 05/19/2017   History of hypercholesterolemia 05/19/2017   History of IBS 05/19/2017   History of migraine 05/19/2017   SVT (supraventricular tachycardia) 05/19/2017   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[place]) 05/19/2017   Primary osteoarthritis of both knees 10/21/2016   Other psoriasis 10/21/2016   Pain in joints of both feet 10/21/2016   S/P hysterectomy 08/22/2013   RLQ abdominal pain 02/25/2012   Ovarian torsion 02/25/2012    PCP: Kennieth Rad MD   REFERRING PROVIDER: Ofilia Neas, PA-C  REFERRING DIAG: M48.10 (ICD-10-CM) - DISH (diffuse idiopathic skeletal hyperostosis)  Rationale for Evaluation and Treatment: Rehabilitation  THERAPY DIAG:  Pain in thoracic spine  ONSET DATE: About 2 years   SUBJECTIVE:  SUBJECTIVE STATEMENT: Still having that pinpoint pain on the Rt side.  Reports compliance with HEP.  Just getting off work for today. Currently 3/10 Rt thoracic region.   Evaluation:  Patient presents to therapy with complaint of RT thoracic pain. Pain began about 2 years ago insidiously. Initially she thought it had to do with her kidney, but later had a cyst removal from RT kidney with lateral/ rib pain, but back pain remained. She denies taking medication for this. They have ruled out kidney stone. She has had some rheumatoid issues in the past and has been diagnosed with DISH.   PERTINENT HISTORY:  Bone spurs, kidney cysts   PAIN:  Are you having pain? Yes: NPRS scale: (worst) 7-8/10; (average) 2-3/10 Pain location: Rt posterior thoracic spine (T9-10 area)  Pain description: constant, dull aching, sharp  Aggravating factors: prolonged sitting, twisting, rolling over in bed  Relieving factors: heating pad, changing positions   PRECAUTIONS:  None  WEIGHT BEARING RESTRICTIONS: No  FALLS:  Has patient fallen in last 6 months? No  LIVING ENVIRONMENT: Lives with: lives with their spouse Lives in: House/apartment  OCCUPATION: Midwife   PLOF: Independent  PATIENT GOALS: Be able to move without hurting   NEXT MD VISIT: Mid summer   OBJECTIVE:   DIAGNOSTIC FINDINGS:  findings are consistent with degenerative disc disease  of the thoracic spine and facet joint arthropathy.  findings are consistent with degenerative disc disease  of the thoracic spine.  SCREENING FOR RED FLAGS: Bowel or bladder incontinence: No Spinal tumors: No Cauda equina syndrome: No Compression fracture: No Abdominal aneurysm: No  COGNITION: Overall cognitive status: Within functional limits for tasks assessed     SENSATION: WFL  PALPATION: Min/ mod TTP about RT thoracic paraspinals   LUMBAR ROM:   AROM eval  Flexion Full  Extension Full  Right lateral flexion 80% limited  Left lateral flexion 25% limited   Right rotation   Left rotation    (Blank rows = not tested)  LOWER EXTREMITY ROM:     Bilateral UE AROM WFL Min restriction in thoracic flexion Severe restriction in RT thoracic rotation Mod restriction in LT thoracic rotation  LOWER EXTREMITY MMT:    UE MMT 4+/5 grossly throughout   TODAY'S TREATMENT:                                                                                                                              DATE:  09/29/22 UBE 3 minutes backward level 1 Standing:  GTB scapular retractions 10X  GTB rows 10X  GTB extensions 10X  Wall push ups  Doorway stretch 3X30" Seated:  scap retraction  Thoracic excursion  rotation Rt/Lt 5X (demo only) Lt sidelying Rt open book stretch  Manual in Lt sidelying to Rt thoracic  09/20/22  Eval   PATIENT EDUCATION:  Education details: on Eval finding, POC and HEP  Person educated: Patient Education method: Explanation Education comprehension: verbalized  understanding  HOME EXERCISE PROGRAM: Access Code: [email or web address] URL: [email or web address]  Date: 09/29/2022 Prepared by: Roseanne Reno Exercises - Doorway Pec Stretch at 60 Elevation  - 2 x daily - 7 x weekly - 1 sets - 3 reps - 30 sec hold - Wall Push Up  - 2 x daily - 7 x weekly - 1 sets - 10 reps Theraband Row, retraction and extensions 10X each green  Date: 09/20/2022 Prepared by: [place]  - 2 x daily - 7 x weekly - 1 sets - 10 reps - 5 second hold - Seated Scapular Retraction  - 2 x daily - 7 x weekly - 1 sets - 10 reps - 5 second hold  ASSESSMENT:  CLINICAL IMPRESSION: Reviewed goals and POC moving forward.  Pt able to demonstrate HEP correctly without cues to correct form.  Began with UBE and added postural strengthening using green theraband. Pt was given theraband and written instructions to add to HEP.  Pt able to complete without c/o pain and minimal cues for form and stabilization.  Manual completed in Lt sidelying to Rt thoracic region.  General tightness/restriction reduced following massage.  Pt also reported reduction in symptoms. Patient will benefit from skilled physical therapy services to address these deficits to reduce pain and improve level of function with ADLs and functional mobility tasks.   OBJECTIVE IMPAIRMENTS: decreased activity tolerance, decreased mobility, decreased ROM, hypomobility, increased fascial restrictions, impaired flexibility, impaired UE functional use, and pain.   ACTIVITY LIMITATIONS: carrying, lifting, bending, sitting, standing, squatting, sleeping, bed mobility, and hygiene/grooming  PARTICIPATION LIMITATIONS: meal prep, cleaning, laundry, shopping, community activity, occupation, and yard work  PERSONAL FACTORS:  NA  are also affecting patient's functional outcome.   REHAB POTENTIAL: Good  CLINICAL DECISION MAKING: Stable/uncomplicated  EVALUATION COMPLEXITY: Low   GOALS: SHORT  TERM GOALS: Target date: 10/04/2022  Patient will be independent with initial HEP and self-management strategies to improve functional outcomes Baseline:  Goal status: IN PROGRESS   LONG TERM GOALS: Target date: 10/18/2022  Patient will be independent with advanced HEP and self-management strategies to improve functional outcomes Baseline:  Goal status: IN PROGRESS  2.  Patient will report at least 50% overall improvement in subjective complaint to indicate improvement in ability to perform ADLs. Baseline:  Goal status: IN PROGRESS  3.  Patient will demo improved RT thoracic rotation by at least 50% in order to improve ability to scan environment for safety and while driving. Baseline: Severe restriction/ 80% limited Goal status: IN PROGRESS  4. Patient will report a decrease in thoracic pain to no more than 3/10 with movement for improved quality of life and ability to perform UE ADLs  Baseline: 7-8/10 Goal status: IN PROGRESS PLAN:  PT FREQUENCY: 1-2x/week  PT DURATION: 4 weeks  PLANNED INTERVENTIONS: Therapeutic exercises, Therapeutic activity, Neuromuscular re-education, Balance training, Gait training, Patient/Family education, Joint manipulation, Joint mobilization, Stair training, Aquatic Therapy, Dry Needling, Electrical stimulation, Spinal manipulation, Spinal mobilization, Cryotherapy, Moist heat, scar mobilization, Taping, Traction, Ultrasound, Biofeedback, Ionotophoresis '4mg'$ /ml Dexamethasone, and Manual therapy. Marland Kitchen  PLAN FOR NEXT SESSION: Progress thoracic mobility as tolerated. Postural/ scapular strengthening. T spine mobs and rib mobs  4:57 PM, 09/29/22 Teena Irani, PTA/CLT [place] Ph: [phone]

## 2022-10-08 ENCOUNTER — Encounter (HOSPITAL_COMMUNITY): Payer: BC Managed Care – PPO

## 2022-10-08 ENCOUNTER — Telehealth (HOSPITAL_COMMUNITY): Payer: Self-pay

## 2022-10-08 NOTE — Telephone Encounter (Signed)
No show # 1; Spoke with patient on the phone; she got her appointment times mixed up and thought she was supposed to come at 2:30 today.  Reminded her of her appointment on Monday 3/18 at 2:30 and she states she will see [place] then.     2:11 PM, 10/08/22 Angela Washington Angela Washington MPT [facility] [place] [phone]

## 2022-10-11 ENCOUNTER — Ambulatory Visit (HOSPITAL_COMMUNITY): Payer: BC Managed Care – PPO | Admitting: Physical Therapy

## 2022-10-11 DIAGNOSIS — M546 Pain in thoracic spine: Secondary | ICD-10-CM | POA: Diagnosis not present

## 2022-10-11 NOTE — Therapy (Addendum)
OUTPATIENT PHYSICAL THERAPY TREATMENT   Patient Name: ARNECIA COLONE MRN: [id number] DOB:Apr 02, 1966, 57 y.o., female Today's Date: 10/11/2022  END OF SESSION:  PT End of Session - 10/11/22 1656     Visit Number 3    Number of Visits 8    Date for PT Re-Evaluation 10/18/22    Authorization Type BCBS PPO    PT Start Time 1434   PT Stop Time 1512   PT Time Calculation (min) 38 min    Activity Tolerance Patient tolerated treatment well    Behavior During Therapy [facility] for tasks assessed/performed             Past Medical History:  Diagnosis Date   DVT (deep venous thrombosis) ([place])    age 48   Headache(784.0)    Hyperlipidemia 2008   diet controlled   Hypertension    Peripheral vascular disease ([place]) 2008   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   PONV (postoperative nausea and vomiting)    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[place])    Thrombophilia   Tachycardia 2008   with the PE   Past Surgical History:  Procedure Laterality Date   APPENDECTOMY  2013   BREAST REDUCTION SURGERY  07/26/1998   CESAREAN SECTION  1994, 1998   [place]   LAPAROSCOPY  02/24/2012   Procedure: LAPAROSCOPY OPERATIVE;  Surgeon: Marvene Staff, MD;  Location: [facility];  Service: Gynecology;  Laterality: Right;  with Lyses of Adhesions   RENAL CYST EXCISION     06/2021   ROBOTIC ASSISTED TOTAL HYSTERECTOMY N/A 08/22/2013   Procedure: ROBOTIC ASSISTED TOTAL HYSTERECTOMY/BILATERAL SALPINGECTOMY;  Surgeon: Marvene Staff, MD;  Location: [facility];  Service: Gynecology;  Laterality: N/A;   SALPINGOOPHORECTOMY  02/24/2012   Procedure: SALPINGO OOPHERECTOMY;  Surgeon: Marvene Staff, MD;  Location: [facility];  Service: Gynecology;  Laterality: Right;  Right Salpingo-Oophorectomy; Left Salpingectomy   TONSILLECTOMY     age 7   [place]   Patient Active Problem List   Diagnosis Date Noted   Renal cyst 07/14/2021   ANA positive 05/19/2017   Family history of lupus  erythematosus 05/19/2017   History of hypertension 05/19/2017   History of hypercholesterolemia 05/19/2017   History of IBS 05/19/2017   History of migraine 05/19/2017   SVT (supraventricular tachycardia) 05/19/2017   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[place]) 05/19/2017   Primary osteoarthritis of both knees 10/21/2016   Other psoriasis 10/21/2016   Pain in joints of both feet 10/21/2016   S/P hysterectomy 08/22/2013   RLQ abdominal pain 02/25/2012   Ovarian torsion 02/25/2012    PCP: Kennieth Rad MD   REFERRING PROVIDER: Ofilia Neas, PA-C  REFERRING DIAG: M48.10 (ICD-10-CM) - DISH (diffuse idiopathic skeletal hyperostosis)  Rationale for Evaluation and Treatment: Rehabilitation  THERAPY DIAG:  Pain in thoracic spine  ONSET DATE: About 2 years   SUBJECTIVE:  SUBJECTIVE STATEMENT: Pt states she's still having some pain but much better.  Currently 1-2/10.  Pt reports she is off until Thursday this week.  Evaluation:  Patient presents to therapy with complaint of RT thoracic pain. Pain began about 2 years ago insidiously. Initially she thought it had to do with her kidney, but later had a cyst removal from RT kidney with lateral/ rib pain, but back pain remained. She denies taking medication for this. They have ruled out kidney stone. She has had some rheumatoid issues in the past and has been diagnosed with DISH.   PERTINENT HISTORY:  Bone spurs, kidney cysts   PAIN:  Are you having pain? Yes: NPRS scale: (worst) 7-8/10; (average) 2-3/10 Pain location: Rt posterior thoracic spine (T9-10 area)  Pain description: constant, dull aching, sharp  Aggravating factors: prolonged sitting, twisting, rolling over in bed  Relieving factors: heating pad, changing positions   PRECAUTIONS: None  WEIGHT BEARING  RESTRICTIONS: No  FALLS:  Has patient fallen in last 6 months? No  LIVING ENVIRONMENT: Lives with: lives with their spouse Lives in: House/apartment  OCCUPATION: Midwife   PLOF: Independent  PATIENT GOALS: Be able to move without hurting   NEXT MD VISIT: Mid summer   OBJECTIVE:   DIAGNOSTIC FINDINGS:  findings are consistent with degenerative disc disease  of the thoracic spine and facet joint arthropathy.  findings are consistent with degenerative disc disease  of the thoracic spine.  SCREENING FOR RED FLAGS: Bowel or bladder incontinence: No Spinal tumors: No Cauda equina syndrome: No Compression fracture: No Abdominal aneurysm: No  COGNITION: Overall cognitive status: Within functional limits for tasks assessed     SENSATION: WFL  PALPATION: Min/ mod TTP about RT thoracic paraspinals   LUMBAR ROM:   AROM eval  Flexion Full  Extension Full  Right lateral flexion 80% limited  Left lateral flexion 25% limited   Right rotation   Left rotation    (Blank rows = not tested)  LOWER EXTREMITY ROM:     Bilateral UE AROM WFL Min restriction in thoracic flexion Severe restriction in RT thoracic rotation Mod restriction in LT thoracic rotation  LOWER EXTREMITY MMT:    UE MMT 4+/5 grossly throughout   TODAY'S TREATMENT:                                                                                                                              DATE:  10/11/22 UBE 4 minutes backward level 1 Standing:  GTB scapular retractions 10X2  GTB rows 10X2  GTB extensions 10X2  [place] GTB 10X2 each direction  Wall push ups 10X2  Doorway stretch 3X30" Seated:  scap retraction 10X  Thoracic excursion  rotation Rt/Lt 5X Manual in Lt sidelying to Rt thoracic  09/29/22 UBE 3 minutes backward level 1 Standing:  GTB scapular retractions 10X  GTB rows 10X  GTB extensions 10X  Wall push ups  Doorway stretch 3X30" Seated:  scap retraction  Thoracic excursion  rotation  Rt/Lt 5X (demo only) Lt sidelying Rt open book stretch  Manual in Lt sidelying to Rt thoracic  09/20/22  Eval   PATIENT EDUCATION:  Education details: on Eval finding, POC and HEP  Person educated: Patient Education method: Explanation Education comprehension: verbalized understanding  HOME EXERCISE PROGRAM: Access Code: [email or web address] URL: [email or web address]  Date: 09/29/2022 Prepared by: Roseanne Reno Exercises - Doorway Pec Stretch at 60 Elevation  - 2 x daily - 7 x weekly - 1 sets - 3 reps - 30 sec hold - Wall Push Up  - 2 x daily - 7 x weekly - 1 sets - 10 reps Theraband Row, retraction and extensions 10X each green  Date: 09/20/2022 Prepared by: [place]  - 2 x daily - 7 x weekly - 1 sets - 10 reps - 5 second hold - Seated Scapular Retraction  - 2 x daily - 7 x weekly - 1 sets - 10 reps - 5 second hold  ASSESSMENT:  CLINICAL IMPRESSION: Continued with focus on Improving postural strength with addition of core stability as well.  Able to increase to 2 sets of reps without pain or fatigue noted.  Added thoracic rotation with noted restriction with Rt UE, none on Lt.  Continued with manual in Lt sidelying to Rt thoracic region.  General tightness/restriction, however much improved from last session with further reduction following soft tissue mobilization.  Patient will continue benefit from skilled physical therapy services to address these deficits to reduce pain and improve level of function with ADLs and functional mobility tasks.   OBJECTIVE IMPAIRMENTS: decreased activity tolerance, decreased mobility, decreased ROM, hypomobility, increased fascial restrictions, impaired flexibility, impaired UE functional use, and pain.   ACTIVITY LIMITATIONS: carrying, lifting, bending, sitting, standing, squatting, sleeping, bed mobility, and hygiene/grooming  PARTICIPATION LIMITATIONS: meal prep, cleaning, laundry, shopping, community  activity, occupation, and yard work  PERSONAL FACTORS:  NA  are also affecting patient's functional outcome.   REHAB POTENTIAL: Good  CLINICAL DECISION MAKING: Stable/uncomplicated  EVALUATION COMPLEXITY: Low   GOALS: SHORT TERM GOALS: Target date: 10/04/2022  Patient will be independent with initial HEP and self-management strategies to improve functional outcomes Baseline:  Goal status: IN PROGRESS   LONG TERM GOALS: Target date: 10/18/2022  Patient will be independent with advanced HEP and self-management strategies to improve functional outcomes Baseline:  Goal status: IN PROGRESS  2.  Patient will report at least 50% overall improvement in subjective complaint to indicate improvement in ability to perform ADLs. Baseline:  Goal status: IN PROGRESS  3.  Patient will demo improved RT thoracic rotation by at least 50% in order to improve ability to scan environment for safety and while driving. Baseline: Severe restriction/ 80% limited Goal status: IN PROGRESS  4. Patient will report a decrease in thoracic pain to no more than 3/10 with movement for improved quality of life and ability to perform UE ADLs  Baseline: 7-8/10 Goal status: IN PROGRESS PLAN:  PT FREQUENCY: 1-2x/week  PT DURATION: 4 weeks  PLANNED INTERVENTIONS: Therapeutic exercises, Therapeutic activity, Neuromuscular re-education, Balance training, Gait training, Patient/Family education, Joint manipulation, Joint mobilization, Stair training, Aquatic Therapy, Dry Needling, Electrical stimulation, Spinal manipulation, Spinal mobilization, Cryotherapy, Moist heat, scar mobilization, Taping, Traction, Ultrasound, Biofeedback, Ionotophoresis 4mg /ml Dexamethasone, and Manual therapy. Marland Kitchen  PLAN FOR NEXT SESSION: Progress thoracic mobility as tolerated. Postural/ scapular strengthening. T spine mobs and rib mobs  4:57 PM, 10/11/22 Teena Irani, PTA/CLT [facility]  [place] Ph:  [phone]

## 2022-10-18 ENCOUNTER — Ambulatory Visit (HOSPITAL_COMMUNITY): Payer: BC Managed Care – PPO | Admitting: Physical Therapy

## 2022-10-18 DIAGNOSIS — M546 Pain in thoracic spine: Secondary | ICD-10-CM | POA: Diagnosis not present

## 2022-10-18 NOTE — Therapy (Addendum)
OUTPATIENT PHYSICAL THERAPY TREATMENT Progress Note Reporting Period 09/02/2022 to 10/18/2022  See note below for Objective Data and Assessment of Progress/Goals.       Patient Name: Angela Washington MRN: [id number] DOB:[birth year], 57 y.o., female Today's Date: 10/11/2022  END OF SESSION:   PT End of Session - 10/18/22 1431     Visit Number 4    Number of Visits 8    Date for PT Re-Evaluation 11/16/22    Authorization Type BCBS PPO    PT Start Time 1431    PT Stop Time 1518    PT Time Calculation (min) 47 min    Activity Tolerance Patient tolerated treatment well    Behavior During Therapy [facility] for tasks assessed/performed                 Past Medical History:  Diagnosis Date   DVT (deep venous thrombosis) ([place])    age 40   Headache(784.0)    Hyperlipidemia 2008   diet controlled   Hypertension    Peripheral vascular disease ([place]) 2008   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   PONV (postoperative nausea and vomiting)    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[place])    Thrombophilia   Tachycardia 2008   with the PE   Past Surgical History:  Procedure Laterality Date   APPENDECTOMY  2013   BREAST REDUCTION SURGERY  07/26/1998   CESAREAN SECTION  1994, 1998   [place]   LAPAROSCOPY  02/24/2012   Procedure: LAPAROSCOPY OPERATIVE;  Surgeon: Marvene Staff, MD;  Location: [facility];  Service: Gynecology;  Laterality: Right;  with Lyses of Adhesions   RENAL CYST EXCISION     06/2021   ROBOTIC ASSISTED TOTAL HYSTERECTOMY N/A 08/22/2013   Procedure: ROBOTIC ASSISTED TOTAL HYSTERECTOMY/BILATERAL SALPINGECTOMY;  Surgeon: Marvene Staff, MD;  Location: [facility];  Service: Gynecology;  Laterality: N/A;   SALPINGOOPHORECTOMY  02/24/2012   Procedure: SALPINGO OOPHERECTOMY;  Surgeon: Marvene Staff, MD;  Location: [facility] [facility];  Service: Gynecology;  Laterality: Right;  Right Salpingo-Oophorectomy; Left Salpingectomy   TONSILLECTOMY     age 50   [place]    Patient Active Problem List   Diagnosis Date Noted   Renal cyst 07/14/2021   ANA positive 05/19/2017   Family history of lupus erythematosus 05/19/2017   History of hypertension 05/19/2017   History of hypercholesterolemia 05/19/2017   History of IBS 05/19/2017   History of migraine 05/19/2017   SVT (supraventricular tachycardia) 05/19/2017   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[place]) 05/19/2017   Primary osteoarthritis of both knees 10/21/2016   Other psoriasis 10/21/2016   Pain in joints of both feet 10/21/2016   S/P hysterectomy 08/22/2013   RLQ abdominal pain 02/25/2012   Ovarian torsion 02/25/2012    PCP: Kennieth Rad MD   REFERRING PROVIDER: Ofilia Neas, PA-C  REFERRING DIAG: M48.10 (ICD-10-CM) - DISH (diffuse idiopathic skeletal hyperostosis)  Rationale for Evaluation and Treatment: Rehabilitation  THERAPY DIAG:  Pain in thoracic spine  ONSET DATE: About 2 years   SUBJECTIVE:  SUBJECTIVE STATEMENT: Pt states she had one episode of "twinging" pain over the weekend, no real reason for it.  Currently without pain or issues. Reports her pain is 50% improved but her rotation is not where she wants it to be on the Right.    Evaluation:  Patient presents to therapy with complaint of RT thoracic pain. Pain began about 2 years ago insidiously. Initially she thought it had to do with her kidney, but later had a cyst removal from RT kidney with lateral/ rib pain, but back pain remained. She denies taking medication for this. They have ruled out kidney stone. She has had some rheumatoid issues in the past and has been diagnosed with DISH.   PERTINENT HISTORY:  Bone spurs, kidney cysts   PAIN:  Are you having pain? Yes: NPRS scale: currently none.  (worst) 5/10; (average) 2-3/10 Pain location: Rt  posterior thoracic spine (T9-10 area)  Pain description: constant, dull aching, sharp  Aggravating factors: prolonged sitting, twisting, rolling over in bed  Relieving factors: heating pad, changing positions   PRECAUTIONS: None  WEIGHT BEARING RESTRICTIONS: No  FALLS:  Has patient fallen in last 6 months? No  LIVING ENVIRONMENT: Lives with: lives with their spouse Lives in: House/apartment  OCCUPATION: Midwife   PLOF: Independent  PATIENT GOALS: Be able to move without hurting     3/25: Be able to rotate to Rt without difficulty  NEXT MD VISIT: Mid summer   OBJECTIVE:   DIAGNOSTIC FINDINGS:  findings are consistent with degenerative disc disease  of the thoracic spine and facet joint arthropathy.  findings are consistent with degenerative disc disease  of the thoracic spine.  SCREENING FOR RED FLAGS: Bowel or bladder incontinence: No Spinal tumors: No Cauda equina syndrome: No Compression fracture: No Abdominal aneurysm: No  COGNITION: Overall cognitive status: Within functional limits for tasks assessed     SENSATION: WFL  PALPATION: Min/ mod TTP about RT thoracic paraspinals   LUMBAR ROM:   AROM eval 10/18/22  Flexion Full WNL  Extension Full WNL  Right lateral flexion 80% limited WNL  Left lateral flexion 25% limited  WNL  Right rotation thoracic  50% limited  Left rotation thoracic  WNL   (Blank rows = not tested)  LOWER EXTREMITY ROM:    From evaluation:  Bilateral UE AROM WFL Min restriction in thoracic flexion Severe restriction in RT thoracic rotation Mod restriction in LT thoracic rotation  LOWER EXTREMITY MMT:   Evaluation:  UE MMT 4+/5 grossly throughout  10/18/22:  5/5 all  TODAY'S TREATMENT:                                                                                                                              DATE:  Progress note 10/18/22 UBE 4 minutes backward level 1 Standing:  GTB scapular retractions 10X2  GTB rows  10X2  GTB extensions 10X2  Paloff GTB 10X2 each direction  GTB horizontal abduction 2X10  each  GTB adduction 2X10 each   Table push ups 10X2 Seated:  scap retraction 10X  Thoracic excursion  rotation Rt/Lt 10X Prone:  planks, modified elbows and knees 3X10" Manual in Lt sidelying to Rt thoracic  10/11/22 UBE 4 minutes backward level 1 Standing:  GTB scapular retractions 10X2  GTB rows 10X2  GTB extensions 10X2  Paloff GTB 10X2 each direction  Wall push ups 10X2  Doorway stretch 3X30" Seated:  scap retraction 10X  Thoracic excursion  rotation Rt/Lt 5X Manual in Lt sidelying to Rt thoracic  09/29/22 UBE 3 minutes backward level 1 Standing:  GTB scapular retractions 10X  GTB rows 10X  GTB extensions 10X  Wall push ups  Doorway stretch 3X30" Seated:  scap retraction  Thoracic excursion  rotation Rt/Lt 5X (demo only) Lt sidelying Rt open book stretch  Manual in Lt sidelying to Rt thoracic  09/20/22  Eval   PATIENT EDUCATION:  Education details: on Eval finding, POC and HEP  Person educated: Patient Education method: Explanation Education comprehension: verbalized understanding  HOME EXERCISE PROGRAM: Access Code: [email or web address] URL: [email or web address]  Date: 09/29/2022 Prepared by: Roseanne Reno Exercises - Doorway Pec Stretch at 60 Elevation  - 2 x daily - 7 x weekly - 1 sets - 3 reps - 30 sec hold - Wall Push Up  - 2 x daily - 7 x weekly - 1 sets - 10 reps Theraband Row, retraction and extensions 10X each green  Date: 09/20/2022 Prepared by: [place]  - 2 x daily - 7 x weekly - 1 sets - 10 reps - 5 second hold - Seated Scapular Retraction  - 2 x daily - 7 x weekly - 1 sets - 10 reps - 5 second hold  ASSESSMENT:  CLINICAL IMPRESSION: 4 week Progress note completed this session. Pt has made good progress in strength, stabilization and reduced overall pain.  Pt will is limited by Rt UE rotation with increased pain  and tightness with motion.  Today's session Continued with focus on Improving Rt rotation,  postural strength and core stability while reducing pain.  Pt able to complete straight set of 20 reps with established theraband exercises.  Added hortizontal abduction with noted challenge having to rest between sets of 10.  Progressed wall push ups to table push ups and began prone planks on elbows and knees for scapular stability.  Continued with manual in Lt sidelying to Rt thoracic region with one "trigger point" area of restriction.  Patient will continue benefit from skilled physical therapy services to address these deficits to reduce pain and improve level of function with ADLs and functional mobility tasks.   OBJECTIVE IMPAIRMENTS: decreased activity tolerance, decreased mobility, decreased ROM, hypomobility, increased fascial restrictions, impaired flexibility, impaired UE functional use, and pain.   ACTIVITY LIMITATIONS: carrying, lifting, bending, sitting, standing, squatting, sleeping, bed mobility, and hygiene/grooming  PARTICIPATION LIMITATIONS: meal prep, cleaning, laundry, shopping, community activity, occupation, and yard work  PERSONAL FACTORS:  NA  are also affecting patient's functional outcome.   REHAB POTENTIAL: Good  CLINICAL DECISION MAKING: Stable/uncomplicated  EVALUATION COMPLEXITY: Low   GOALS: SHORT TERM GOALS: Target date: 10/04/2022  Patient will be independent with initial HEP and self-management strategies to improve functional outcomes Baseline:  Goal status: MET   LONG TERM GOALS: Target date: 10/18/2022  Patient will be independent with advanced HEP and self-management strategies to improve functional outcomes Baseline:  Goal status: IN PROGRESS  2.  Patient will report  at least 50% overall improvement in subjective complaint to indicate improvement in ability to perform ADLs. Baseline:  Goal status: MET  3.  Patient will demo improved RT thoracic  rotation by at least 50% in order to improve ability to scan environment for safety and while driving. Baseline: evaluation: Severe restriction/ 80% limited, 3/25:  50% limited Goal status: IN PROGRESS  4. Patient will report a decrease in thoracic pain to no more than 3/10 with movement for improved quality of life and ability to perform UE ADLs  Baseline: evaluation: 7-8/10; 3/25: no greater than 5/10 Goal status: IN PROGRESS PLAN:  PT FREQUENCY: 1-2x/week  PT DURATION: 4 weeks  PLANNED INTERVENTIONS: Therapeutic exercises, Therapeutic activity, Neuromuscular re-education, Balance training, Gait training, Patient/Family education, Joint manipulation, Joint mobilization, Stair training, Aquatic Therapy, Dry Needling, Electrical stimulation, Spinal manipulation, Spinal mobilization, Cryotherapy, Moist heat, scar mobilization, Taping, Traction, Ultrasound, Biofeedback, Ionotophoresis 4mg /ml Dexamethasone, and Manual therapy. Marland Kitchen  PLAN FOR NEXT SESSION: Progress thoracic mobility as tolerated. Postural/ scapular strengthening. T spine mobs and rib mobs  Teena Irani, PTA/CLT [place] Ph: [phone]   Teena Irani, PTA 10/18/2022, 4:01 PM

## 2022-10-26 NOTE — Addendum Note (Signed)
Addended by: Josue Hector A on: 10/26/2022 08:09 AM   Modules accepted: Orders

## 2022-11-02 ENCOUNTER — Ambulatory Visit (HOSPITAL_COMMUNITY): Payer: BC Managed Care – PPO | Attending: Physician Assistant | Admitting: Physical Therapy

## 2022-11-02 DIAGNOSIS — M546 Pain in thoracic spine: Secondary | ICD-10-CM | POA: Insufficient documentation

## 2022-11-09 ENCOUNTER — Encounter (HOSPITAL_COMMUNITY): Payer: Self-pay

## 2022-11-09 ENCOUNTER — Ambulatory Visit (HOSPITAL_COMMUNITY): Payer: BC Managed Care – PPO

## 2022-11-09 DIAGNOSIS — M546 Pain in thoracic spine: Secondary | ICD-10-CM

## 2022-11-09 NOTE — Therapy (Signed)
OUTPATIENT PHYSICAL THERAPY TREATMENT   Patient Name: Angela Washington MRN: [id number] DOB:03-28-1966, 57 y.o., female Today's Date: 10/11/2022  END OF SESSION:   PT End of Session - 11/09/22 1600     Visit Number 5    Number of Visits 8    Date for PT Re-Evaluation 11/16/22    Authorization Type BCBS PPO    PT Start Time 1540    PT Stop Time 1625    PT Time Calculation (min) 45 min    Activity Tolerance Patient tolerated treatment well    Behavior During Therapy [facility] for tasks assessed/performed                  Past Medical History:  Diagnosis Date   DVT (deep venous thrombosis)    age 26   Headache(784.0)    Hyperlipidemia 2008   diet controlled   Hypertension    Peripheral vascular disease 2008   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   PONV (postoperative nausea and vomiting)    Prothrombin deficiency    Thrombophilia   Tachycardia 2008   with the PE   Past Surgical History:  Procedure Laterality Date   APPENDECTOMY  2013   BREAST REDUCTION SURGERY  07/26/1998   CESAREAN SECTION  1994, 1998   2   CHOLECYSTECTOMY  1999   LAPAROSCOPY  02/24/2012   Procedure: LAPAROSCOPY OPERATIVE;  Surgeon: Serita Kyle, MD;  Location: [facility];  Service: Gynecology;  Laterality: Right;  with Lyses of Adhesions   RENAL CYST EXCISION     06/2021   ROBOTIC ASSISTED TOTAL HYSTERECTOMY N/A 08/22/2013   Procedure: ROBOTIC ASSISTED TOTAL HYSTERECTOMY/BILATERAL SALPINGECTOMY;  Surgeon: Serita Kyle, MD;  Location: [facility];  Service: Gynecology;  Laterality: N/A;   SALPINGOOPHORECTOMY  02/24/2012   Procedure: SALPINGO OOPHERECTOMY;  Surgeon: Serita Kyle, MD;  Location: [facility];  Service: Gynecology;  Laterality: Right;  Right Salpingo-Oophorectomy; Left Salpingectomy   TONSILLECTOMY     age 27   TUBAL LIGATION  1998   Patient Active Problem List   Diagnosis Date Noted   Renal cyst 07/14/2021   ANA positive 05/19/2017   Family history of lupus erythematosus  05/19/2017   History of hypertension 05/19/2017   History of hypercholesterolemia 05/19/2017   History of IBS 05/19/2017   History of migraine 05/19/2017   SVT (supraventricular tachycardia) 05/19/2017   Prothrombin gene mutation 05/19/2017   Primary osteoarthritis of both knees 10/21/2016   Other psoriasis 10/21/2016   Pain in joints of both feet 10/21/2016   S/P hysterectomy 08/22/2013   RLQ abdominal pain 02/25/2012   Ovarian torsion 02/25/2012    PCP: Aggie Cosier MD   REFERRING PROVIDER: Gearldine Bienenstock, PA-C Next apt June 2024  REFERRING DIAG: M48.10 (ICD-10-CM) - DISH (diffuse idiopathic skeletal hyperostosis)  Rationale for Evaluation and Treatment: Rehabilitation  THERAPY DIAG:  Pain in thoracic spine  ONSET DATE: About 2 years   SUBJECTIVE:  SUBJECTIVE STATEMENT: Pt reports spasms have been bad for the last 3 days.  No change in position helps.  Continues to have difficulty with Rt thoracic rotation.  Current pain scale 1-2/10  Evaluation:  Patient presents to therapy with complaint of RT thoracic pain. Pain began about 2 years ago insidiously. Initially she thought it had to do with her kidney, but later had a cyst removal from RT kidney with lateral/ rib pain, but back pain remained. She denies taking medication for this. They have ruled out kidney stone. She has had some rheumatoid issues in the past and has been diagnosed with DISH.   PERTINENT HISTORY:  Bone spurs, kidney cysts   PAIN:  Are you having pain? Yes: NPRS scale: currently none.  (worst) 5/10; (average) 2-3/10 Pain location: Rt posterior thoracic spine (T9-10 area)  Pain description: constant, dull aching, sharp  Aggravating factors: prolonged sitting, twisting, rolling over in bed  Relieving factors: heating pad,  changing positions   PRECAUTIONS: None  WEIGHT BEARING RESTRICTIONS: No  FALLS:  Has patient fallen in last 6 months? No  LIVING ENVIRONMENT: Lives with: lives with their spouse Lives in: House/apartment  OCCUPATION: Midwife   PLOF: Independent  PATIENT GOALS: Be able to move without hurting     3/25: Be able to rotate to Rt without difficulty  NEXT MD VISIT: Mid summer   OBJECTIVE:   DIAGNOSTIC FINDINGS:  findings are consistent with degenerative disc disease  of the thoracic spine and facet joint arthropathy.  findings are consistent with degenerative disc disease  of the thoracic spine.  SCREENING FOR RED FLAGS: Bowel or bladder incontinence: No Spinal tumors: No Cauda equina syndrome: No Compression fracture: No Abdominal aneurysm: No  COGNITION: Overall cognitive status: Within functional limits for tasks assessed     SENSATION: WFL  PALPATION: Min/ mod TTP about RT thoracic paraspinals   LUMBAR ROM:   AROM eval 10/18/22  Flexion Full WNL  Extension Full WNL  Right lateral flexion 80% limited WNL  Left lateral flexion 25% limited  WNL  Right rotation thoracic  50% limited  Left rotation thoracic  WNL   (Blank rows = not tested)  LOWER EXTREMITY ROM:    From evaluation:  Bilateral UE AROM WFL Min restriction in thoracic flexion Severe restriction in RT thoracic rotation Mod restriction in LT thoracic rotation  LOWER EXTREMITY MMT:   Evaluation:  UE MMT 4+/5 grossly throughout  10/18/22:  5/5 all  TODAY'S TREATMENT:                                                                                                                              DATE:  11/09/22: UBE 4 minutes backward level 1 Quadruped: Cat/camel 5x 10" holds  Rotation 5x  Child's pose all direction Seated 3D thoracic excursion Manual prone to periscapular, latissimus dorsi, scapular mobs, UE PROM  Progress note 10/18/22 UBE 4 minutes backward level 1 Standing:  GTB scapular  retractions 10X2  GTB rows 10X2  GTB extensions 10X2  Paloff GTB 10X2 each direction  GTB horizontal abduction 2X10 each  GTB adduction 2X10 each   Table push ups 10X2 Seated:  scap retraction 10X  Thoracic excursion  rotation Rt/Lt 10X Prone:  planks, modified elbows and knees 3X10" Manual in Lt sidelying to Rt thoracic  10/11/22 UBE 4 minutes backward level 1 Standing:  GTB scapular retractions 10X2  GTB rows 10X2  GTB extensions 10X2  Paloff GTB 10X2 each direction  Wall push ups 10X2  Doorway stretch 3X30" Seated:  scap retraction 10X  Thoracic excursion  rotation Rt/Lt 5X Manual in Lt sidelying to Rt thoracic  09/29/22 UBE 3 minutes backward level 1 Standing:  GTB scapular retractions 10X  GTB rows 10X  GTB extensions 10X  Wall push ups  Doorway stretch 3X30" Seated:  scap retraction  Thoracic excursion  rotation Rt/Lt 5X (demo only) Lt sidelying Rt open book stretch  Manual in Lt sidelying to Rt thoracic  09/20/22  Eval   PATIENT EDUCATION:  Education details: on Eval finding, POC and HEP  Person educated: Patient Education method: Explanation Education comprehension: verbalized understanding  HOME EXERCISE PROGRAM: Access Code: [email or web address] URL: [email or web address]  Date: 09/29/2022 Prepared by: Emeline Gins Exercises - Doorway Pec Stretch at 60 Elevation  - 2 x daily - 7 x weekly - 1 sets - 3 reps - 30 sec hold - Wall Push Up  - 2 x daily - 7 x weekly - 1 sets - 10 reps Theraband Row, retraction and extensions 10X each green  Date: 09/20/2022 Prepared by: Georges Lynch Exercises - Sidelying Open Book  - 2 x daily - 7 x weekly - 1 sets - 10 reps - 5 second hold - Seated Scapular Retraction  - 2 x daily - 7 x weekly - 1 sets - 10 reps - 5 second hold  ASSESSMENT:  CLINICAL IMPRESSION: Pt arrived with reports of increased spasms and pain in the last 3 weeks with no reports of change in activity.  Session focus today on thoracic mobility  and pain control.  Added quadruped yoga activities and manual techniques to address moderate fascial restrictions.  EOS pt presents with improved Rt rotation.  Encouraged to stay hydrated to reduce risk of headache following manual.  Pt given list of local massage therapists and discussed benefits.  4 week Progress note completed this session. Pt has made good progress in strength, stabilization and reduced overall pain.  Pt will is limited by Rt UE rotation with increased pain and tightness with motion.  Today's session Continued with focus on Improving Rt rotation,  postural strength and core stability while reducing pain.  Pt able to complete straight set of 20 reps with established theraband exercises.  Added hortizontal abduction with noted challenge having to rest between sets of 10.  Progressed wall push ups to table push ups and began prone planks on elbows and knees for scapular stability.  Continued with manual in Lt sidelying to Rt thoracic region with one "trigger point" area of restriction.  Patient will continue benefit from skilled physical therapy services to address these deficits to reduce pain and improve level of function with ADLs and functional mobility tasks.   OBJECTIVE IMPAIRMENTS: decreased activity tolerance, decreased mobility, decreased ROM, hypomobility, increased fascial restrictions, impaired flexibility, impaired UE functional use, and pain.   ACTIVITY LIMITATIONS: carrying, lifting, bending, sitting, standing, squatting, sleeping, bed mobility, and hygiene/grooming  PARTICIPATION LIMITATIONS: meal prep, cleaning, laundry,  shopping, community activity, occupation, and yard work  PERSONAL FACTORS:  NA  are also affecting patient's functional outcome.   REHAB POTENTIAL: Good  CLINICAL DECISION MAKING: Stable/uncomplicated  EVALUATION COMPLEXITY: Low   GOALS: SHORT TERM GOALS: Target date: 10/04/2022  Patient will be independent with initial HEP and self-management  strategies to improve functional outcomes Baseline:  Goal status: MET   LONG TERM GOALS: Target date: 10/18/2022  Patient will be independent with advanced HEP and self-management strategies to improve functional outcomes Baseline:  Goal status: IN PROGRESS  2.  Patient will report at least 50% overall improvement in subjective complaint to indicate improvement in ability to perform ADLs. Baseline:  Goal status: MET  3.  Patient will demo improved RT thoracic rotation by at least 50% in order to improve ability to scan environment for safety and while driving. Baseline: evaluation: Severe restriction/ 80% limited, 3/25:  50% limited Goal status: IN PROGRESS  4. Patient will report a decrease in thoracic pain to no more than 3/10 with movement for improved quality of life and ability to perform UE ADLs  Baseline: evaluation: 7-8/10; 3/25: no greater than 5/10 Goal status: IN PROGRESS PLAN:  PT FREQUENCY: 1-2x/week  PT DURATION: 4 weeks  PLANNED INTERVENTIONS: Therapeutic exercises, Therapeutic activity, Neuromuscular re-education, Balance training, Gait training, Patient/Family education, Joint manipulation, Joint mobilization, Stair training, Aquatic Therapy, Dry Needling, Electrical stimulation, Spinal manipulation, Spinal mobilization, Cryotherapy, Moist heat, scar mobilization, Taping, Traction, Ultrasound, Biofeedback, Ionotophoresis 4mg /ml Dexamethasone, and Manual therapy. Marland Kitchen  PLAN FOR NEXT SESSION: Progress thoracic mobility as tolerated. Postural/ scapular strengthening. T spine mobs and rib mobs  Becky Sax, LPTA/CLT; CBIS [phone]  Juel Burrow, PTA 11/09/2022, 4:33 PM

## 2022-11-15 ENCOUNTER — Ambulatory Visit (HOSPITAL_COMMUNITY): Payer: BC Managed Care – PPO | Admitting: Physical Therapy

## 2022-11-15 DIAGNOSIS — M546 Pain in thoracic spine: Secondary | ICD-10-CM | POA: Diagnosis not present

## 2022-11-15 NOTE — Therapy (Signed)
OUTPATIENT PHYSICAL THERAPY TREATMENT   Patient Name: Angela Washington MRN: [id number] DOB:[birth year], 57 y.o., female Today's Date: 10/11/2022  END OF SESSION:   PT End of Session - 11/15/22 1436     Visit Number 6    Number of Visits 8    Date for PT Re-Evaluation 11/16/22    Authorization Type [employer] [place] approved 6 visits from 3/25-5/23    Authorization - Visit Number 3    Authorization - Number of Visits 6    PT Start Time 1436    PT Stop Time 1510    PT Time Calculation (min) 34 min    Activity Tolerance Patient tolerated treatment well    Behavior During Therapy [facility] for tasks assessed/performed                  Past Medical History:  Diagnosis Date   DVT (deep venous thrombosis)    age 33   Headache(784.0)    Hyperlipidemia 2008   diet controlled   Hypertension    Peripheral vascular disease 2008   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   PONV (postoperative nausea and vomiting)    Prothrombin deficiency    Thrombophilia   Tachycardia 2008   with the PE   Past Surgical History:  Procedure Laterality Date   APPENDECTOMY  2013   BREAST REDUCTION SURGERY  07/26/1998   CESAREAN SECTION  1994, 1998   2   CHOLECYSTECTOMY  1999   LAPAROSCOPY  02/24/2012   Procedure: LAPAROSCOPY OPERATIVE;  Surgeon: Serita Kyle, MD;  Location: [facility];  Service: Gynecology;  Laterality: Right;  with Lyses of Adhesions   RENAL CYST EXCISION     06/2021   ROBOTIC ASSISTED TOTAL HYSTERECTOMY N/A 08/22/2013   Procedure: ROBOTIC ASSISTED TOTAL HYSTERECTOMY/BILATERAL SALPINGECTOMY;  Surgeon: Serita Kyle, MD;  Location: [facility];  Service: Gynecology;  Laterality: N/A;   SALPINGOOPHORECTOMY  02/24/2012   Procedure: SALPINGO OOPHERECTOMY;  Surgeon: Serita Kyle, MD;  Location: [facility];  Service: Gynecology;  Laterality: Right;  Right Salpingo-Oophorectomy; Left Salpingectomy   TONSILLECTOMY     age 75   TUBAL LIGATION  1998   Patient Active Problem List    Diagnosis Date Noted   Renal cyst 07/14/2021   ANA positive 05/19/2017   Family history of lupus erythematosus 05/19/2017   History of hypertension 05/19/2017   History of hypercholesterolemia 05/19/2017   History of IBS 05/19/2017   History of migraine 05/19/2017   SVT (supraventricular tachycardia) 05/19/2017   Prothrombin gene mutation 05/19/2017   Primary osteoarthritis of both knees 10/21/2016   Other psoriasis 10/21/2016   Pain in joints of both feet 10/21/2016   S/P hysterectomy 08/22/2013   RLQ abdominal pain 02/25/2012   Ovarian torsion 02/25/2012    PCP: Aggie Cosier MD   REFERRING PROVIDER: Gearldine Bienenstock, PA-C Next apt June 2024  REFERRING DIAG: M48.10 (ICD-10-CM) - DISH (diffuse idiopathic skeletal hyperostosis)  Rationale for Evaluation and Treatment: Rehabilitation  THERAPY DIAG:  Pain in thoracic spine  ONSET DATE: About 2 years   SUBJECTIVE:  SUBJECTIVE STATEMENT: Pt reports spasms have been worse the past 2 weeks and unknown what has caused it to change.  Continues to have difficulty with Rt thoracic rotation.  Current pain scale 3/10.  Does not return to MD until June of 2024  Evaluation:  Patient presents to therapy with complaint of RT thoracic pain. Pain began about 2 years ago insidiously. Initially she thought it had to do with her kidney, but later had a cyst removal from RT kidney with lateral/ rib pain, but back pain remained. She denies taking medication for this. They have ruled out kidney stone. She has had some rheumatoid issues in the past and has been diagnosed with DISH.   PERTINENT HISTORY:  Bone spurs, kidney cysts   PAIN:  Are you having pain? Yes: NPRS scale: 3/5  (worst) 5/10; (average) 2-3/10 Pain location: Rt posterior thoracic spine (T9-10 area)   Pain description: constant, dull aching, sharp  Aggravating factors: prolonged sitting, twisting, rolling over in bed  Relieving factors: heating pad, changing positions   PRECAUTIONS: None  WEIGHT BEARING RESTRICTIONS: No  FALLS:  Has patient fallen in last 6 months? No  LIVING ENVIRONMENT: Lives with: lives with their spouse Lives in: House/apartment  OCCUPATION: Midwife   PLOF: Independent  PATIENT GOALS: Be able to move without hurting     3/25: Be able to rotate to Rt without difficulty  NEXT MD VISIT: Mid summer   OBJECTIVE:   DIAGNOSTIC FINDINGS:  findings are consistent with degenerative disc disease  of the thoracic spine and facet joint arthropathy.  findings are consistent with degenerative disc disease  of the thoracic spine.  SCREENING FOR RED FLAGS: Bowel or bladder incontinence: No Spinal tumors: No Cauda equina syndrome: No Compression fracture: No Abdominal aneurysm: No  COGNITION: Overall cognitive status: Within functional limits for tasks assessed     SENSATION: WFL  PALPATION: Min/ mod TTP about RT thoracic paraspinals   LUMBAR ROM:   AROM eval 10/18/22  Flexion Full WNL  Extension Full WNL  Right lateral flexion 80% limited WNL  Left lateral flexion 25% limited  WNL  Right rotation thoracic  50% limited  Left rotation thoracic  WNL   (Blank rows = not tested)  LOWER EXTREMITY ROM:    From evaluation:  Bilateral UE AROM WFL Min restriction in thoracic flexion Severe restriction in RT thoracic rotation Mod restriction in LT thoracic rotation  LOWER EXTREMITY MMT:   Evaluation:  UE MMT 4+/5 grossly throughout  10/18/22:  5/5 all  TODAY'S TREATMENT:                                                                                                                              DATE:   11/15/22: UBE 4 minutes backward level 1 Quadruped: Cat/camel 5x 10" holds, Rotation 5x  Child's pose all direction 5X Standing doorway stretch  pulling with Rt and rotating to Rt side 3X Seated leaning over and pulling  across to Lt to stretch Rt paraspinals 3X Manual seated with UE elevated and rested over mat table (elevated) to Rt periscapular, latissimus dorsi, paraspinals  11/09/22: UBE 4 minutes backward level 1 Quadruped: Cat/camel 5x 10" holds  Rotation 5x  Child's pose all direction Seated 3D thoracic excursion Manual prone to periscapular, latissimus dorsi, scapular mobs, UE PROM  Progress note 10/18/22 UBE 4 minutes backward level 1 Standing:  GTB scapular retractions 10X2  GTB rows 10X2  GTB extensions 10X2  Paloff GTB 10X2 each direction  GTB horizontal abduction 2X10 each  GTB adduction 2X10 each   Table push ups 10X2 Seated:  scap retraction 10X  Thoracic excursion  rotation Rt/Lt 10X Prone:  planks, modified elbows and knees 3X10" Manual in Lt sidelying to Rt thoracic  10/11/22 UBE 4 minutes backward level 1 Standing:  GTB scapular retractions 10X2  GTB rows 10X2  GTB extensions 10X2  Paloff GTB 10X2 each direction  Wall push ups 10X2  Doorway stretch 3X30" Seated:  scap retraction 10X  Thoracic excursion  rotation Rt/Lt 5X Manual in Lt sidelying to Rt thoracic  09/29/22 UBE 3 minutes backward level 1 Standing:  GTB scapular retractions 10X  GTB rows 10X  GTB extensions 10X  Wall push ups  Doorway stretch 3X30" Seated:  scap retraction  Thoracic excursion  rotation Rt/Lt 5X (demo only) Lt sidelying Rt open book stretch  Manual in Lt sidelying to Rt thoracic  09/20/22  Eval   PATIENT EDUCATION:  Education details: on Eval finding, POC and HEP  Person educated: Patient Education method: Explanation Education comprehension: verbalized understanding  HOME EXERCISE PROGRAM: Access Code: [email or web address] URL: [email or web address]  Date: 09/29/2022 Prepared by: Emeline Gins Exercises - Doorway Pec Stretch at 60 Elevation  - 2 x daily - 7 x weekly - 1 sets - 3 reps - 30 sec hold -  Wall Push Up  - 2 x daily - 7 x weekly - 1 sets - 10 reps Theraband Row, retraction and extensions 10X each green  Date: 09/20/2022 Prepared by: Georges Lynch Exercises - Sidelying Open Book  - 2 x daily - 7 x weekly - 1 sets - 10 reps - 5 second hold - Seated Scapular Retraction  - 2 x daily - 7 x weekly - 1 sets - 10 reps - 5 second hold  ASSESSMENT:  CLINICAL IMPRESSION: Continues to have increased spasms and pain in the Rt side past couple of weeks with no reports of change in activity.  Session focus today on thoracic mobility and pain control.  Continued with quadruped stretches with isolation to Rt thoracic region with changing of positions.  Continued with manual to Rt thoracic region focusing mainly on paraspinals and lats.  Most tightness in this region as well.  Pt reported overall improvement following manual.  Pt will continue to benefit from skilled therapy to address deficits and improve overall functional status.  10/18/22:  4 week Progress note completed this session. Pt has made good progress in strength, stabilization and reduced overall pain.  Pt will is limited by Rt UE rotation with increased pain and tightness with motion.  Today's session Continued with focus on Improving Rt rotation,  postural strength and core stability while reducing pain.  Pt able to complete straight set of 20 reps with established theraband exercises.  Added hortizontal abduction with noted challenge having to rest between sets of 10.  Progressed wall push ups to table push ups and began prone planks on elbows and knees  for scapular stability.  Continued with manual in Lt sidelying to Rt thoracic region with one "trigger point" area of restriction.  Patient will continue benefit from skilled physical therapy services to address these deficits to reduce pain and improve level of function with ADLs and functional mobility tasks.   OBJECTIVE IMPAIRMENTS: decreased activity tolerance, decreased mobility,  decreased ROM, hypomobility, increased fascial restrictions, impaired flexibility, impaired UE functional use, and pain.   ACTIVITY LIMITATIONS: carrying, lifting, bending, sitting, standing, squatting, sleeping, bed mobility, and hygiene/grooming  PARTICIPATION LIMITATIONS: meal prep, cleaning, laundry, shopping, community activity, occupation, and yard work  PERSONAL FACTORS:  NA  are also affecting patient's functional outcome.   REHAB POTENTIAL: Good  CLINICAL DECISION MAKING: Stable/uncomplicated  EVALUATION COMPLEXITY: Low   GOALS: SHORT TERM GOALS: Target date: 10/04/2022  Patient will be independent with initial HEP and self-management strategies to improve functional outcomes Baseline:  Goal status: MET   LONG TERM GOALS: Target date: 10/18/2022  Patient will be independent with advanced HEP and self-management strategies to improve functional outcomes Baseline:  Goal status: IN PROGRESS  2.  Patient will report at least 50% overall improvement in subjective complaint to indicate improvement in ability to perform ADLs. Baseline:  Goal status: MET  3.  Patient will demo improved RT thoracic rotation by at least 50% in order to improve ability to scan environment for safety and while driving. Baseline: evaluation: Severe restriction/ 80% limited, 3/25:  50% limited Goal status: IN PROGRESS  4. Patient will report a decrease in thoracic pain to no more than 3/10 with movement for improved quality of life and ability to perform UE ADLs  Baseline: evaluation: 7-8/10; 3/25: no greater than 5/10 Goal status: IN PROGRESS PLAN:  PT FREQUENCY: 1-2x/week  PT DURATION: 4 weeks  PLANNED INTERVENTIONS: Therapeutic exercises, Therapeutic activity, Neuromuscular re-education, Balance training, Gait training, Patient/Family education, Joint manipulation, Joint mobilization, Stair training, Aquatic Therapy, Dry Needling, Electrical stimulation, Spinal manipulation, Spinal  mobilization, Cryotherapy, Moist heat, scar mobilization, Taping, Traction, Ultrasound, Biofeedback, Ionotophoresis /ml Dexamethasone, and Manual therapy. Marland Kitchen  PLAN FOR NEXT SESSION: Recert next session.  Continue with manual and exercises to help reduce symptoms.  Lurena Nida, PTA/CLT [facility] Ph: [phone]  Lurena Nida, PTA 11/15/2022, 3:12 PM

## 2022-11-22 ENCOUNTER — Encounter (HOSPITAL_COMMUNITY): Payer: BC Managed Care – PPO | Admitting: Physical Therapy

## 2022-11-22 ENCOUNTER — Ambulatory Visit (HOSPITAL_COMMUNITY): Payer: BC Managed Care – PPO | Admitting: Physical Therapy

## 2022-11-22 DIAGNOSIS — M546 Pain in thoracic spine: Secondary | ICD-10-CM

## 2022-11-22 NOTE — Therapy (Signed)
OUTPATIENT PHYSICAL THERAPY TREATMENT   Patient Name: Angela Washington MRN: [id number] DOB:[birth year], 57 y.o., female Today's Date: 10/11/2022  PHYSICAL THERAPY DISCHARGE SUMMARY  Visits from Start of Care: 7  Current functional level related to goals / functional outcomes: See below   Remaining deficits: See below    Education / Equipment: See assessment    Patient agrees to discharge. Patient goals were partially met. Patient is being discharged due to maximized rehab potential.    END OF SESSION:   PT End of Session - 11/22/22 1112     Visit Number 7    Number of Visits 8    Date for PT Re-Evaluation 11/22/22    Authorization Type [employer] [place] approved 6 visits from 3/25-5/23    Authorization - Number of Visits 6    PT Start Time 1115    PT Stop Time 1154    PT Time Calculation (min) 39 min    Activity Tolerance Patient tolerated treatment well    Behavior During Therapy [facility] for tasks assessed/performed                  Past Medical History:  Diagnosis Date   DVT (deep venous thrombosis) (HCC)    age 68   Headache(784.0)    Hyperlipidemia 2008   diet controlled   Hypertension    Peripheral vascular disease (HCC) 2008   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   PONV (postoperative nausea and vomiting)    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HCC)    Thrombophilia   Tachycardia 2008   with the PE   Past Surgical History:  Procedure Laterality Date   APPENDECTOMY  2013   BREAST REDUCTION SURGERY  07/26/1998   CESAREAN SECTION  1994, 1998   2   CHOLECYSTECTOMY  1999   LAPAROSCOPY  02/24/2012   Procedure: LAPAROSCOPY OPERATIVE;  Surgeon: Serita Kyle, MD;  Location: [facility];  Service: Gynecology;  Laterality: Right;  with Lyses of Adhesions   RENAL CYST EXCISION     06/2021   ROBOTIC ASSISTED TOTAL HYSTERECTOMY N/A 08/22/2013   Procedure: ROBOTIC ASSISTED TOTAL HYSTERECTOMY/BILATERAL SALPINGECTOMY;  Surgeon: Serita Kyle, MD;  Location: [facility];   Service: Gynecology;  Laterality: N/A;   SALPINGOOPHORECTOMY  02/24/2012   Procedure: SALPINGO OOPHERECTOMY;  Surgeon: Serita Kyle, MD;  Location: [facility];  Service: Gynecology;  Laterality: Right;  Right Salpingo-Oophorectomy; Left Salpingectomy   TONSILLECTOMY     age 62   TUBAL LIGATION  1998   Patient Active Problem List   Diagnosis Date Noted   Renal cyst 07/14/2021   ANA positive 05/19/2017   Family history of lupus erythematosus 05/19/2017   History of hypertension 05/19/2017   History of hypercholesterolemia 05/19/2017   History of IBS 05/19/2017   History of migraine 05/19/2017   SVT (supraventricular tachycardia) 05/19/2017   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HCC) 05/19/2017   Primary osteoarthritis of both knees 10/21/2016   Other psoriasis 10/21/2016   Pain in joints of both feet 10/21/2016   S/P hysterectomy 08/22/2013   RLQ abdominal pain 02/25/2012   Ovarian torsion 02/25/2012    PCP: Aggie Cosier MD   REFERRING PROVIDER: Gearldine Bienenstock, PA-C Next apt June 2024  REFERRING DIAG: M48.10 (ICD-10-CM) - DISH (diffuse idiopathic skeletal hyperostosis)  Rationale for Evaluation and Treatment: Rehabilitation  THERAPY DIAG:  Pain in thoracic spine - Plan: PT plan of care cert/re-cert  ONSET DATE: About 2 years   SUBJECTIVE:  SUBJECTIVE STATEMENT: Just now getting better from recent muscle spasms in back. Felt some progress was made up to this point, but continues to have spasms intermittently with no known reason.   Evaluation:  Patient presents to therapy with complaint of RT thoracic pain. Pain began about 2 years ago insidiously. Initially she thought it had to do with her kidney, but later had a cyst removal from RT kidney with lateral/ rib pain, but back pain remained. She  denies taking medication for this. They have ruled out kidney stone. She has had some rheumatoid issues in the past and has been diagnosed with DISH.   PERTINENT HISTORY:  Bone spurs, kidney cysts   PAIN:  Are you having pain? Yes: NPRS scale: 2/10 Pain location: Rt posterior thoracic spine (T9-10 area)  Pain description: constant, dull aching, sharp  Aggravating factors: prolonged sitting, twisting, rolling over in bed  Relieving factors: heating pad, changing positions   PRECAUTIONS: None  WEIGHT BEARING RESTRICTIONS: No  FALLS:  Has patient fallen in last 6 months? No  LIVING ENVIRONMENT: Lives with: lives with their spouse Lives in: House/apartment  OCCUPATION: Midwife   PLOF: Independent  PATIENT GOALS: Be able to move without hurting     3/25: Be able to rotate to Rt without difficulty  NEXT MD VISIT: Mid summer   OBJECTIVE:   DIAGNOSTIC FINDINGS:  findings are consistent with degenerative disc disease  of the thoracic spine and facet joint arthropathy.  findings are consistent with degenerative disc disease  of the thoracic spine.  SCREENING FOR RED FLAGS: Bowel or bladder incontinence: No Spinal tumors: No Cauda equina syndrome: No Compression fracture: No Abdominal aneurysm: No  COGNITION: Overall cognitive status: Within functional limits for tasks assessed     SENSATION: WFL  PALPATION: Min/ mod TTP about RT thoracic paraspinals   LUMBAR ROM:   AROM eval 10/18/22 11/22/22  Flexion Full WNL WNL  Extension Full WNL 25% Limited  Right lateral flexion 80% limited WNL 25% limited  Left lateral flexion 25% limited  WNL WNL  Right rotation thoracic  50% limited   Left rotation thoracic  WNL    (Blank rows = not tested)  THORACIC AROM: AROM  Flexion  Extension  Right lateral flexion  Left lateral flexion  Right rotation thoracic  Left rotation thoracic   11/22/22  25% limited  WNL  25% limited  WNL  25% limited  WNL   LOWER  EXTREMITY ROM:    From evaluation:  Bilateral UE AROM WFL Min restriction in thoracic flexion Severe restriction in RT thoracic rotation Mod restriction in LT thoracic rotation  LOWER EXTREMITY MMT:   Evaluation:  UE MMT 4+/5 grossly throughout  10/18/22:  5/5 all  TODAY'S TREATMENT:                                                                                                                              DATE:  11/22/22 Reassess  AROM Cat/ cow Open book stretch HEP review   11/15/22: UBE 4 minutes backward level 1 Quadruped: Cat/camel 5x 10" holds, Rotation 5x  Child's pose all direction 5X Standing doorway stretch pulling with Rt and rotating to Rt side 3X Seated leaning over and pulling across to Lt to stretch Rt paraspinals 3X Manual seated with UE elevated and rested over mat table (elevated) to Rt periscapular, latissimus dorsi, paraspinals  11/09/22: UBE 4 minutes backward level 1 Quadruped: Cat/camel 5x 10" holds  Rotation 5x  Child's pose all direction Seated 3D thoracic excursion Manual prone to periscapular, latissimus dorsi, scapular mobs, UE PROM  Progress note 10/18/22 UBE 4 minutes backward level 1 Standing:  GTB scapular retractions 10X2  GTB rows 10X2  GTB extensions 10X2  Paloff GTB 10X2 each direction  GTB horizontal abduction 2X10 each  GTB adduction 2X10 each   Table push ups 10X2 Seated:  scap retraction 10X  Thoracic excursion  rotation Rt/Lt 10X Prone:  planks, modified elbows and knees 3X10" Manual in Lt sidelying to Rt thoracic  10/11/22 UBE 4 minutes backward level 1 Standing:  GTB scapular retractions 10X2  GTB rows 10X2  GTB extensions 10X2  Paloff GTB 10X2 each direction  Wall push ups 10X2  Doorway stretch 3X30" Seated:  scap retraction 10X  Thoracic excursion  rotation Rt/Lt 5X Manual in Lt sidelying to Rt thoracic  09/29/22 UBE 3 minutes backward level 1 Standing:  GTB scapular retractions 10X  GTB rows 10X  GTB extensions  10X  Wall push ups  Doorway stretch 3X30" Seated:  scap retraction  Thoracic excursion  rotation Rt/Lt 5X (demo only) Lt sidelying Rt open book stretch  Manual in Lt sidelying to Rt thoracic  09/20/22  Eval   PATIENT EDUCATION:  Education details: on Eval finding, POC and HEP  Person educated: Patient Education method: Explanation Education comprehension: verbalized understanding  HOME EXERCISE PROGRAM: Access Code: [email or web address] URL: [email or web address]  11/22/22 Access Code: [email or web address] URL: [email or web address] Date: 11/22/2022 Prepared by: Georges Lynch  Exercises - Sidelying Thoracic and Shoulder Rotation  - 1-2 x daily - 7 x weekly - 1 sets - 10 reps - 5 second hold - Seated Scapular Retraction  - 1-2 x daily - 7 x weekly - 1 sets - 10 reps - 5 second hold - Doorway Pec Stretch at 60 Elevation  - 1-2 x daily - 7 x weekly - 1 sets - 3 reps - 30 sec hold - Wall Push Up  - 1-2 x daily - 7 x weekly - 1 sets - 10 reps - Cat Cow to Child's Pose  - 1-2 x daily - 7 x weekly - 1 sets - 10 reps - 3-5 second hold - Standing Shoulder Row with Anchored Resistance  - 1-2 x daily - 7 x weekly - 2 sets - 10 reps - Shoulder extension with resistance - Neutral  - 1-2 x daily - 7 x weekly - 2 sets - 10 reps - Drawing Bow  - 1-2 x daily - 7 x weekly - 2 sets - 10 reps - Bird Dog  - 1-2 x daily - 7 x weekly - 2 sets - 10 reps - Quadruped Thoracic Rotation Full Range with Hand on Neck  - 1-2 x daily - 7 x weekly - 1 sets - 10 reps - 3-5 second hold   Date: 09/29/2022 Prepared by: Emeline Gins Exercises - Doorway Pec Stretch at 60 Elevation  - 2 x daily - 7 x weekly -  1 sets - 3 reps - 30 sec hold - Wall Push Up  - 2 x daily - 7 x weekly - 1 sets - 10 reps Theraband Row, retraction and extensions 10X each green  Date: 09/20/2022 Prepared by: Georges Lynch Exercises - Sidelying Open Book  - 2 x daily - 7 x weekly - 1 sets - 10 reps - 5 second hold - Seated  Scapular Retraction  - 2 x daily - 7 x weekly - 1 sets - 10 reps - 5 second hold  ASSESSMENT:  CLINICAL IMPRESSION: Patient shows limited objective improvement. Some improvement in AROM, but still stiff and somewhat pain limited. Ongoing restrictions in thoracic mobility despite regular adherence to HEP. At this time, patient has likely reached plateau in function and has follow up with referring provider next month. Reviewed progress toward therapy goals and comprehensive HEP. Answered all patient questions and issued updated HEP handout. Patient DC today, encouraged patient to follow up with therapy services with any further questions or concerns.   OBJECTIVE IMPAIRMENTS: decreased activity tolerance, decreased mobility, decreased ROM, hypomobility, increased fascial restrictions, impaired flexibility, impaired UE functional use, and pain.   ACTIVITY LIMITATIONS: carrying, lifting, bending, sitting, standing, squatting, sleeping, bed mobility, and hygiene/grooming  PARTICIPATION LIMITATIONS: meal prep, cleaning, laundry, shopping, community activity, occupation, and yard work  PERSONAL FACTORS:  NA  are also affecting patient's functional outcome.   REHAB POTENTIAL: Good  CLINICAL DECISION MAKING: Stable/uncomplicated  EVALUATION COMPLEXITY: Low   GOALS: SHORT TERM GOALS: Target date: 10/04/2022  Patient will be independent with initial HEP and self-management strategies to improve functional outcomes Baseline:  Goal status: MET   LONG TERM GOALS: Target date: 10/18/2022  Patient will be independent with advanced HEP and self-management strategies to improve functional outcomes Baseline:  Goal status: MET  2.  Patient will report at least 50% overall improvement in subjective complaint to indicate improvement in ability to perform ADLs. Baseline: 40%  Goal status: NOT MET  3.  Patient will demo improved RT thoracic rotation by at least 50% in order to improve ability to scan  environment for safety and while driving. Baseline: evaluation: Severe restriction/ 80% limited, 3/25:  50% limited Goal status: Partially MET  4. Patient will report a decrease in thoracic pain to no more than 3/10 with movement for improved quality of life and ability to perform UE ADLs  Baseline: evaluation: 2-3/10 but is variable day to day  Goal status: Partially MET   PLAN:  PT FREQUENCY: 1-2x/week  PT DURATION: 4 weeks  PLANNED INTERVENTIONS: Therapeutic exercises, Therapeutic activity, Neuromuscular re-education, Balance training, Gait training, Patient/Family education, Joint manipulation, Joint mobilization, Stair training, Aquatic Therapy, Dry Needling, Electrical stimulation, Spinal manipulation, Spinal mobilization, Cryotherapy, Moist heat, scar mobilization, Taping, Traction, Ultrasound, Biofeedback, Ionotophoresis 4mg /ml Dexamethasone, and Manual therapy. Marland Kitchen  PLAN FOR NEXT SESSION: DC to HEP    1:17 PM, 11/22/22 Georges Lynch PT DPT  Physical Therapist with [facility]  [phone]

## 2022-12-13 IMAGING — CT CT ABD-PELV W/ CM
2 of 5 series · 16 of 46 positions shown, 18 images · IV contrast (APPLIED)
Comparison: CT the abdomen and pelvis 09/26/2014.

CLINICAL DATA: 55-year-old female with history of right upper
quadrant and right-sided flank pain.

EXAM:
CT ABDOMEN AND PELVIS WITH CONTRAST
TECHNIQUE: Multidetector CT imaging of the abdomen and pelvis was performed
using the standard protocol following bolus administration of
intravenous contrast.
CONTRAST:  75mL OMNIPAQUE IOHEXOL 350 MG/ML SOLN

[Series 2: abd pel w · axial · 0.79mm/px · z∈[+846,+1286]mm · 13 of 98 slices shown, 15 images]
[im 5/98  soft-tissue]
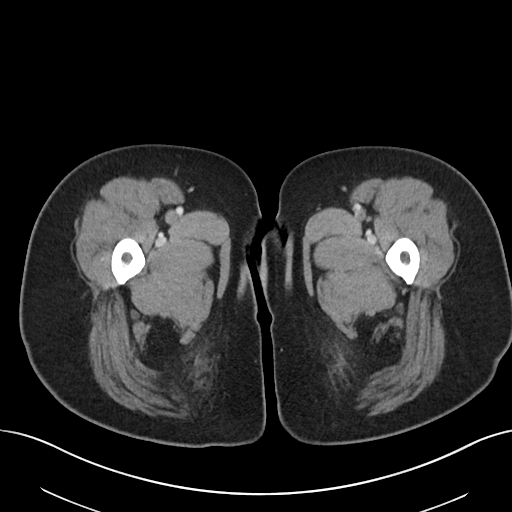
[im 5/98  bone]
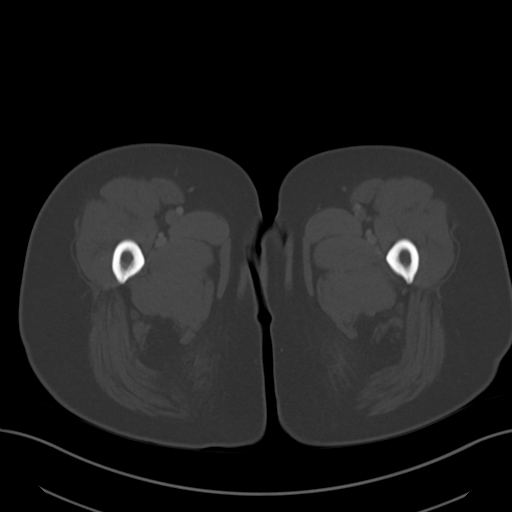
[im 15/98  soft-tissue]
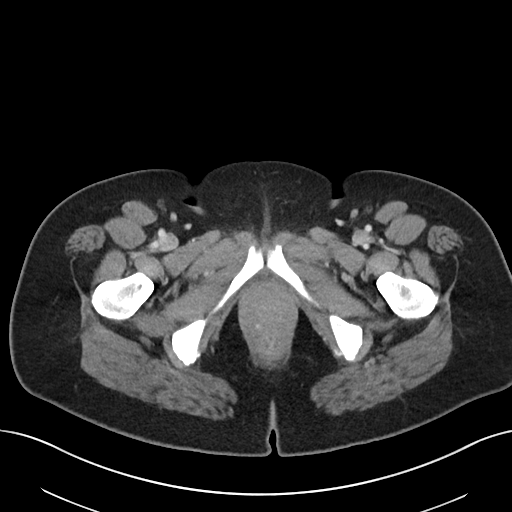
[im 20/98  soft-tissue]
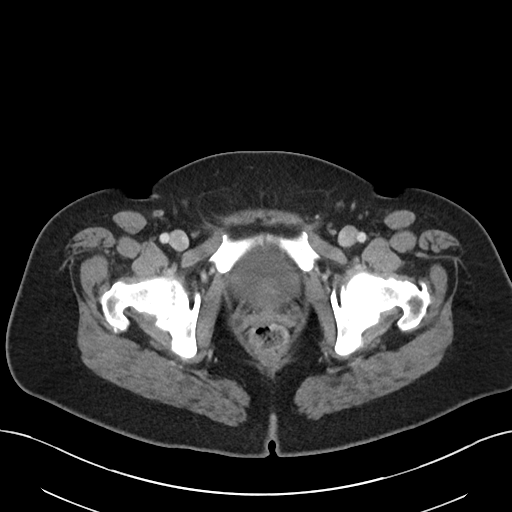
[im 30/98  soft-tissue]
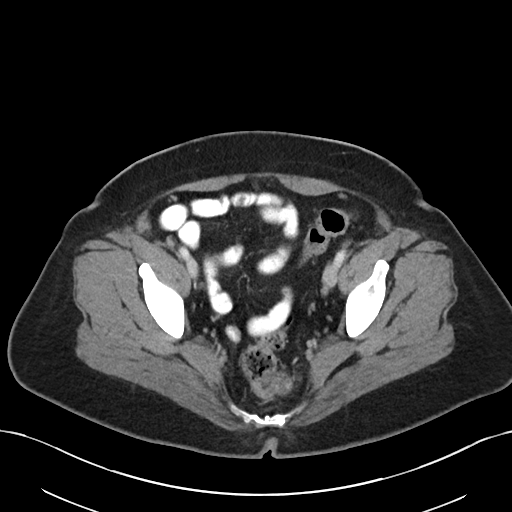
[im 34/98  soft-tissue]
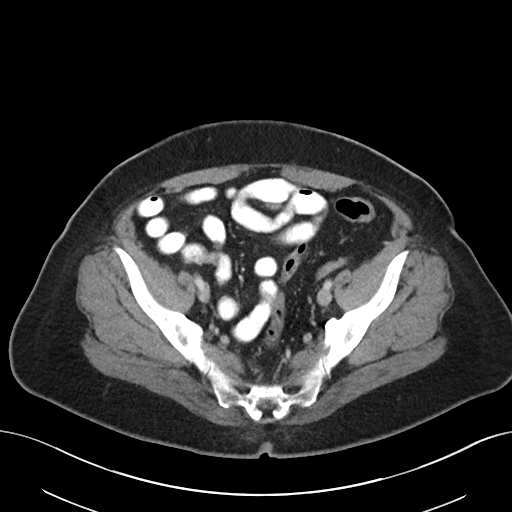
[im 44/98  soft-tissue]
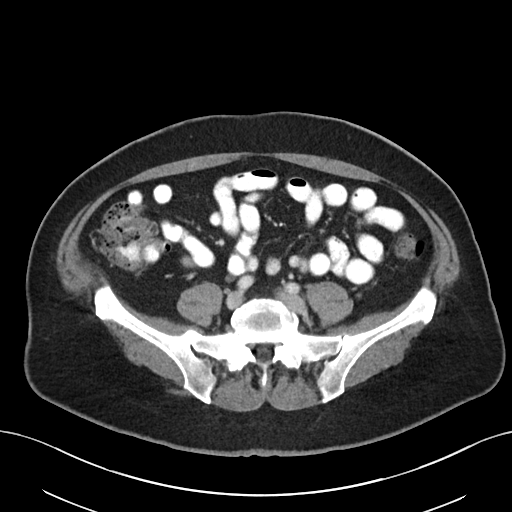
[im 49/98  soft-tissue]
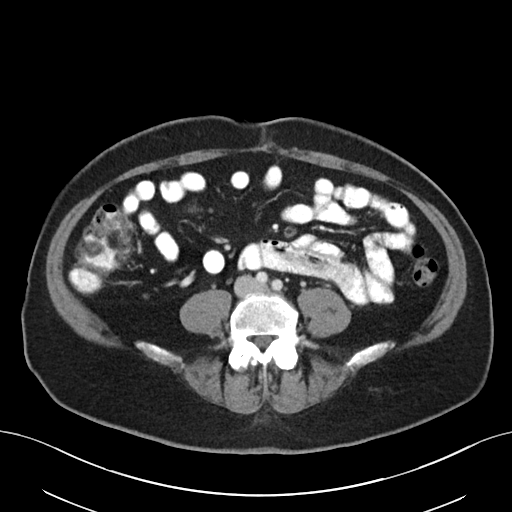
[im 54/98  soft-tissue]
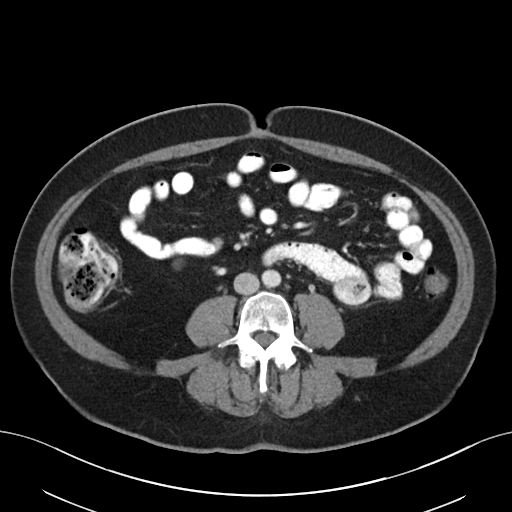
[im 64/98  soft-tissue]
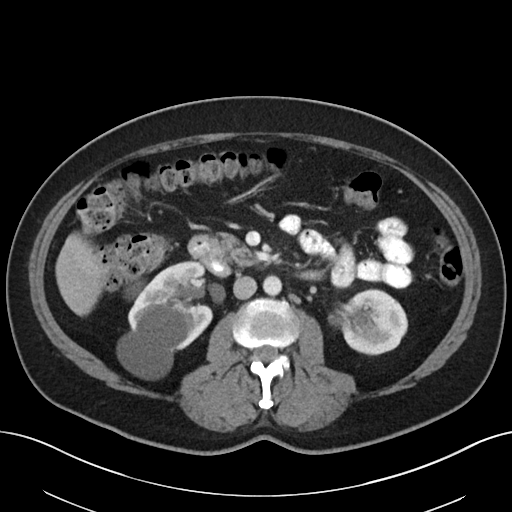
[im 64/98  bone]
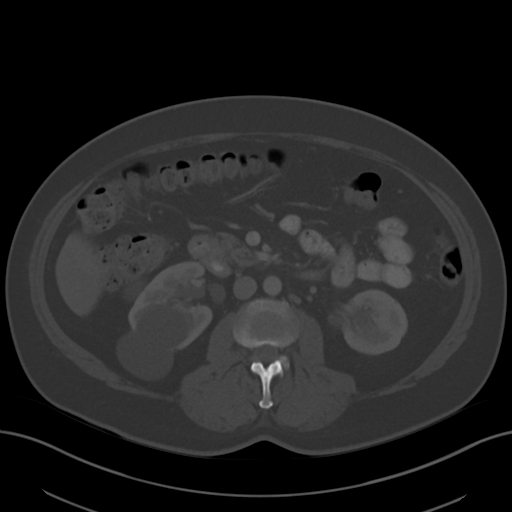
[im 68/98  soft-tissue]
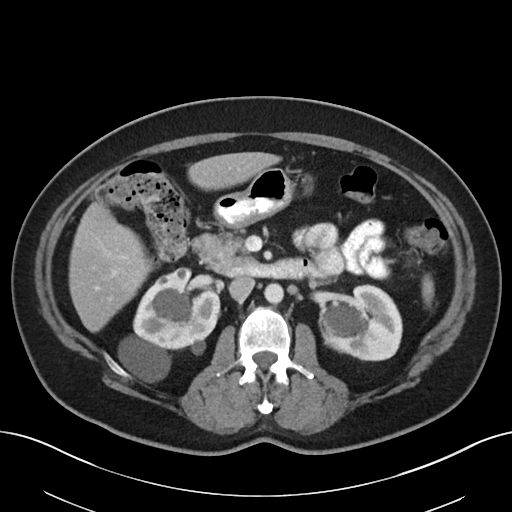
[im 78/98  soft-tissue]
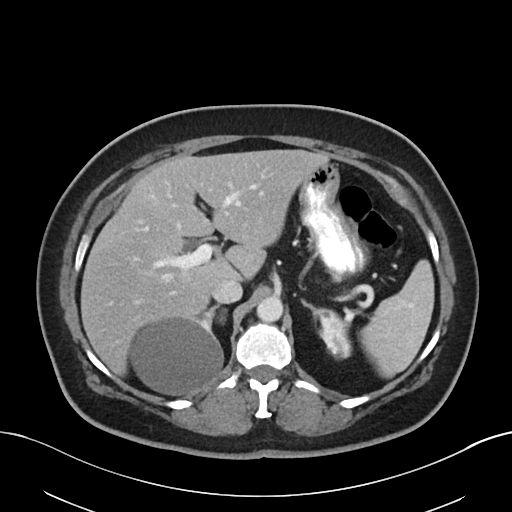
[im 83/98  soft-tissue]
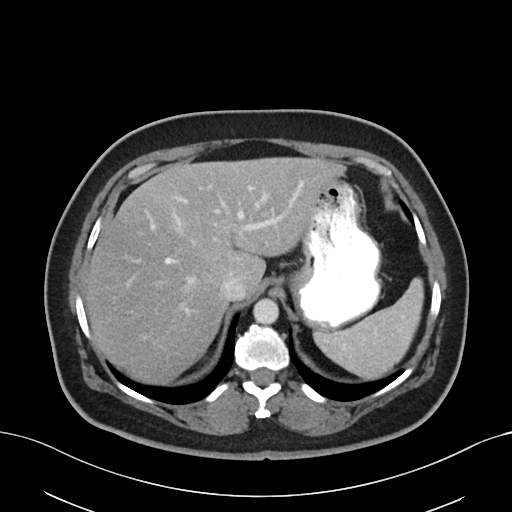
[im 93/98  soft-tissue]
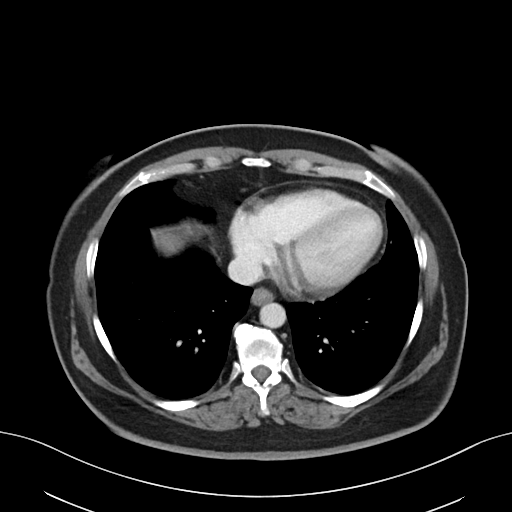

[Series 5: coronal · coronal · 0.81mm/px · 3 of 97 slices shown]
[im 33/97  soft-tissue]
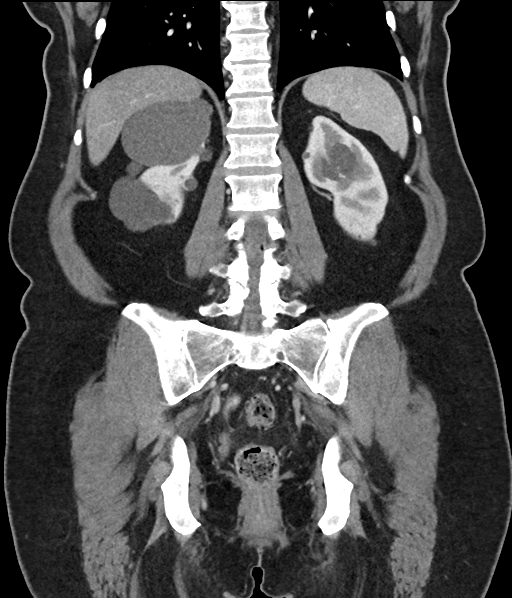
[im 43/97  soft-tissue]
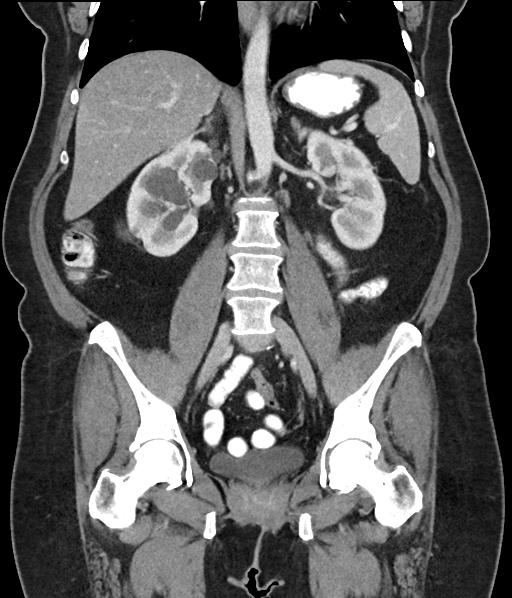
[im 54/97  soft-tissue]
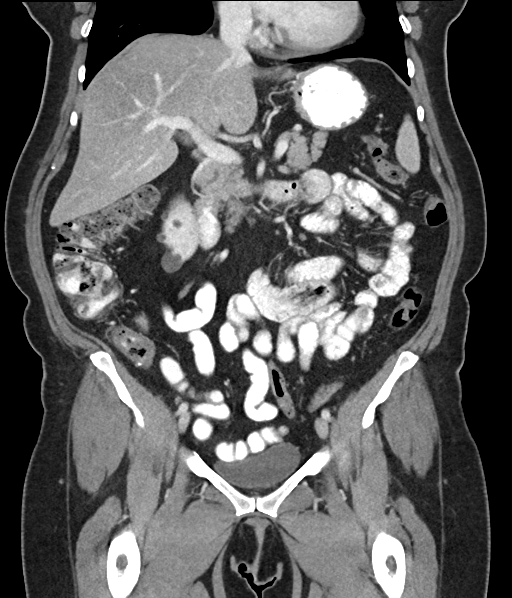

[16 of 46 positions shown; findings below may reference images not displayed]

FINDINGS: Lower chest: Unremarkable.

Hepatobiliary: Diffuse low attenuation throughout the hepatic
parenchyma, concerning for hepatic steatosis (difficult to confirm
on today's contrast enhanced examination). No suspicious cystic or
solid hepatic lesions. No intra or extrahepatic biliary ductal
dilatation. Status post cholecystectomy.

Pancreas: No pancreatic mass. No pancreatic ductal dilatation. No
pancreatic or peripancreatic fluid collections or inflammatory
changes.

Spleen: Unremarkable.

Adrenals/Urinary Tract: Multiple low-attenuation lesions are again
noted in both kidneys, compatible with simple cysts, largest of
which is exophytic in the upper pole of the right kidney (axial
image 22 of series 2) measuring 7.4 cm in diameter. In addition,
there are innumerable smaller subcentimeter low-attenuation lesions
in both kidneys, too small to characterize, but favored to represent
tiny cysts. Multiple small parapelvic cysts are also noted in the
kidneys bilaterally (left greater than right). No
hydroureteronephrosis. Urinary bladder is normal in appearance.
Bilateral adrenal glands are normal in appearance.

Stomach/Bowel: The appearance of the stomach is normal. No
pathologic dilatation of small bowel or colon. Status post
appendectomy.

Vascular/Lymphatic: No significant atherosclerotic disease, aneurysm
or dissection noted in the abdominal or pelvic vasculature. No
lymphadenopathy noted in the abdomen or pelvis.

Reproductive: Status post hysterectomy. Right ovary is not
confidently identified may be surgically absent or atrophic. Left
ovary is unremarkable.

Other: No significant volume of ascites.  No pneumoperitoneum.

Musculoskeletal: There are no aggressive appearing lytic or blastic
lesions noted in the visualized portions of the skeleton.
IMPRESSION: 1. No acute findings are noted in the abdomen or pelvis to account
for the patient's symptoms.
2. Probable hepatic steatosis.
3. Numerous bilateral renal cysts, as above.
4. Additional incidental findings, as above.

## 2022-12-14 DIAGNOSIS — Z1231 Encounter for screening mammogram for malignant neoplasm of breast: Secondary | ICD-10-CM | POA: Diagnosis not present

## 2022-12-27 NOTE — Progress Notes (Deleted)
Office Visit Note  Patient: Angela Washington             Date of Birth: Sep 30, 1965           MRN: [id number]             PCP: Aggie Cosier, MD Referring: Aggie Cosier, MD Visit Date: 01/10/2023 Occupation: @GUAROCC @  Subjective:    History of Present Illness: Angela Washington is a 57 y.o. female with history of DISH and osteoarthritis.   PT?    Activities of Daily Living:  Patient reports morning stiffness for all day. Patient Reports nocturnal pain.  Difficulty dressing/grooming: Denies Difficulty climbing stairs: Denies Difficulty getting out of chair: Denies Difficulty using hands for taps, buttons, cutlery, and/or writing: Denies  Review of Systems  Constitutional:  Negative for fatigue.  HENT:  Negative for mouth sores and mouth dryness.   Eyes:  Negative for dryness.  Respiratory:  Negative for shortness of breath.   Cardiovascular:  Negative for chest pain and palpitations.  Gastrointestinal:  Negative for blood in stool, constipation and diarrhea.  Endocrine: Negative for increased urination.  Genitourinary:  Negative for involuntary urination.  Musculoskeletal:  Positive for joint pain, joint pain, myalgias, morning stiffness, muscle tenderness and myalgias. Negative for gait problem, joint swelling and muscle weakness.  Skin:  Negative for color change, rash, hair loss and sensitivity to sunlight.  Allergic/Immunologic: Negative for susceptible to infections.  Neurological:  Negative for dizziness and headaches.  Hematological:  Negative for swollen glands.  Psychiatric/Behavioral:  Positive for sleep disturbance. Negative for depressed mood. The patient is not nervous/anxious.     PMFS History:  Patient Active Problem List   Diagnosis Date Noted   Renal cyst 07/14/2021   ANA positive 05/19/2017   Family history of lupus erythematosus 05/19/2017   History of hypertension 05/19/2017   History of hypercholesterolemia 05/19/2017   History of IBS 05/19/2017    History of migraine 05/19/2017   SVT (supraventricular tachycardia) 05/19/2017   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HCC) 05/19/2017   Primary osteoarthritis of both knees 10/21/2016   Other psoriasis 10/21/2016   Pain in joints of both feet 10/21/2016   S/P hysterectomy 08/22/2013   RLQ abdominal pain 02/25/2012   Ovarian torsion 02/25/2012    Past Medical History:  Diagnosis Date   DVT (deep venous thrombosis) (HCC)    age 62   Headache(784.0)    Hyperlipidemia 2008   diet controlled   Hypertension    Peripheral vascular disease (HCC) 2008   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   PONV (postoperative nausea and vomiting)    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HCC)    Thrombophilia   Tachycardia 2008   with the PE    Family History  Problem Relation Age of Onset   Parkinsonism Mother    Heart attack Mother    Heart attack Father    Thyroid cancer Sister    Healthy Son    Healthy Son    Past Surgical History:  Procedure Laterality Date   APPENDECTOMY  2013   BREAST REDUCTION SURGERY  07/26/1998   CESAREAN SECTION  1994, 1998   2   CHOLECYSTECTOMY  1999   LAPAROSCOPY  02/24/2012   Procedure: LAPAROSCOPY OPERATIVE;  Surgeon: Serita Kyle, MD;  Location: [facility];  Service: Gynecology;  Laterality: Right;  with Lyses of Adhesions   RENAL CYST EXCISION     06/2021   ROBOTIC ASSISTED TOTAL HYSTERECTOMY N/A 08/22/2013   Procedure: ROBOTIC ASSISTED TOTAL  HYSTERECTOMY/BILATERAL SALPINGECTOMY;  Surgeon: Serita Kyle, MD;  Location: [facility];  Service: Gynecology;  Laterality: N/A;   SALPINGOOPHORECTOMY  02/24/2012   Procedure: SALPINGO OOPHERECTOMY;  Surgeon: Serita Kyle, MD;  Location: [facility];  Service: Gynecology;  Laterality: Right;  Right Salpingo-Oophorectomy; Left Salpingectomy   TONSILLECTOMY     age 24   TUBAL LIGATION  24   Social History   Social History Narrative   Not on file    There is no immunization history on file for this patient.   Objective: Vital  Signs: LMP 08/10/2013 Comment: ablation   Physical Exam Vitals and nursing note reviewed.  Constitutional:      Appearance: She is well-developed.  HENT:     Head: Normocephalic and atraumatic.  Eyes:     Conjunctiva/sclera: Conjunctivae normal.  Cardiovascular:     Rate and Rhythm: Normal rate and regular rhythm.     Heart sounds: Normal heart sounds.  Pulmonary:     Effort: Pulmonary effort is normal.     Breath sounds: Normal breath sounds.  Abdominal:     General: Bowel sounds are normal.     Palpations: Abdomen is soft.  Musculoskeletal:     Cervical back: Normal range of motion.  Lymphadenopathy:     Cervical: No cervical adenopathy.  Skin:    General: Skin is warm and dry.     Capillary Refill: Capillary refill takes less than 2 seconds.  Neurological:     Mental Status: She is alert and oriented to person, place, and time.  Psychiatric:        Behavior: Behavior normal.      Musculoskeletal Exam: ***  CDAI Exam: CDAI Score: -- Patient Global: --; Provider Global: -- Swollen: --; Tender: -- Joint Exam 01/10/2023   No joint exam has been documented for this visit   There is currently no information documented on the homunculus. Go to the Rheumatology activity and complete the homunculus joint exam.  Investigation: No additional findings.  Imaging: No results found.  Recent Labs: Lab Results  Component Value Date   WBC 7.2 07/07/2021   HGB 14.2 07/15/2021   PLT 265 07/07/2021   NA 136 07/15/2021   K 4.2 07/15/2021   CL 101 07/15/2021   CO2 25 07/15/2021   GLUCOSE 190 (H) 07/15/2021   BUN 15 07/15/2021   CREATININE 0.83 07/15/2021   BILITOT 0.5 05/22/2018   ALKPHOS 72 11/22/2017   AST 24 05/22/2018   ALT 41 (H) 05/22/2018   PROT 7.2 05/22/2018   ALBUMIN 3.7 11/22/2017   CALCIUM 8.8 (L) 07/15/2021   GFRAA 75 05/22/2018    Speciality Comments: No specialty comments available.  Procedures:  No procedures performed Allergies: Morphine and  codeine   Assessment / Plan:     Visit Diagnoses: Polyarthralgia  DISH (diffuse idiopathic skeletal hyperostosis)  Positive ANA (antinuclear antibody)  Primary osteoarthritis of both knees  Psoriasis  Trochanteric bursitis of left hip  DDD (degenerative disc disease), thoracic  Limited active range of motion (AROM) of thoracic spine on rotation  DDD (degenerative disc disease), lumbar  Primary osteoarthritis of both feet  History of insomnia  History of IBS  History of hypercholesterolemia  Hx of migraines  History of cholecystectomy  History of hypertension  History of appendectomy  SVT (supraventricular tachycardia)  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HCC)  Family history of lupus erythematosus  Orders: No orders of the defined types were placed in this encounter.  No orders of the defined types  were placed in this encounter.   Face-to-face time spent with patient was *** minutes. Greater than 50% of time was spent in counseling and coordination of care.  Follow-Up Instructions: No follow-ups on file.   Ofilia Neas, PA-C  Note - This record has been created using Dragon software.  Chart creation errors have been sought, but may not always  have been located. Such creation errors do not reflect on  the standard of medical care.

## 2023-01-10 ENCOUNTER — Encounter: Payer: Self-pay | Admitting: Physician Assistant

## 2023-01-10 ENCOUNTER — Ambulatory Visit (INDEPENDENT_AMBULATORY_CARE_PROVIDER_SITE_OTHER): Payer: BC Managed Care – PPO | Admitting: Physician Assistant

## 2023-01-10 VITALS — BP 130/79 | HR 62 | Resp 14 | Ht 64.0 in | Wt 177.2 lb

## 2023-01-10 DIAGNOSIS — Z8719 Personal history of other diseases of the digestive system: Secondary | ICD-10-CM

## 2023-01-10 DIAGNOSIS — Z87898 Personal history of other specified conditions: Secondary | ICD-10-CM

## 2023-01-10 DIAGNOSIS — M2550 Pain in unspecified joint: Secondary | ICD-10-CM

## 2023-01-10 DIAGNOSIS — Z8669 Personal history of other diseases of the nervous system and sense organs: Secondary | ICD-10-CM

## 2023-01-10 DIAGNOSIS — Z9049 Acquired absence of other specified parts of digestive tract: Secondary | ICD-10-CM

## 2023-01-10 DIAGNOSIS — M19071 Primary osteoarthritis, right ankle and foot: Secondary | ICD-10-CM

## 2023-01-10 DIAGNOSIS — Z8679 Personal history of other diseases of the circulatory system: Secondary | ICD-10-CM

## 2023-01-10 DIAGNOSIS — M5134 Other intervertebral disc degeneration, thoracic region: Secondary | ICD-10-CM

## 2023-01-10 DIAGNOSIS — Z8639 Personal history of other endocrine, nutritional and metabolic disease: Secondary | ICD-10-CM

## 2023-01-10 DIAGNOSIS — R768 Other specified abnormal immunological findings in serum: Secondary | ICD-10-CM

## 2023-01-10 DIAGNOSIS — D6852 Prothrombin gene mutation: Secondary | ICD-10-CM

## 2023-01-10 DIAGNOSIS — M5384 Other specified dorsopathies, thoracic region: Secondary | ICD-10-CM

## 2023-01-10 DIAGNOSIS — M4810 Ankylosing hyperostosis [Forestier], site unspecified: Secondary | ICD-10-CM

## 2023-01-10 DIAGNOSIS — M7062 Trochanteric bursitis, left hip: Secondary | ICD-10-CM

## 2023-01-10 DIAGNOSIS — M170 Bilateral primary osteoarthritis of knee: Secondary | ICD-10-CM

## 2023-01-10 DIAGNOSIS — M5136 Other intervertebral disc degeneration, lumbar region: Secondary | ICD-10-CM

## 2023-01-10 DIAGNOSIS — I471 Supraventricular tachycardia, unspecified: Secondary | ICD-10-CM

## 2023-01-10 DIAGNOSIS — L409 Psoriasis, unspecified: Secondary | ICD-10-CM

## 2023-01-10 DIAGNOSIS — Z840 Family history of diseases of the skin and subcutaneous tissue: Secondary | ICD-10-CM

## 2023-01-10 MED ORDER — METHOCARBAMOL 500 MG PO TABS: 500.0000 mg | ORAL_TABLET | Freq: Two times a day (BID) | ORAL | 0 refills | Status: DC | PRN

## 2023-01-10 NOTE — Progress Notes (Unsigned)
Office visit encounter for today 01/10/23 was canceled after lengthy discussion with me in the office. Patient was last seen in the office on 08/16/2022-referral to orthopedics was placed for management of DISH.  Patient completed physical therapy for thoracic spinal pain but had no improvement in her symptoms.  She reported experiencing muscle spasms more frequently. As stated in the prior office visit note the patient was to notify us if her symptoms persisted or worsened at which time a prescription for methocarbamol could be sent to the pharmacy.  At today's encounter I initially asked the patient if she had any response to methocarbamol but the patient became frustrated since she was never given a prescription for methocarbamol to try. I did not receive any phone calls or MyChart messages requesting a prescription for methocarbamol to be sent to the pharmacy.  The patient voiced her frustrations with having to follow-up in our office given that she has not been diagnosed with a rheumatologic/autoimmune disease. Discussed that she has a low titer ANA but otherwise does not meet clinical criteria for any systemic autoimmune disease at this time.  Discussed that we usually recheck ANAs on a yearly basis unless new or worsening symptoms develop.  Patient plans on having her PCP check her autoimmune lab work from now on. According to the patient the diagnosis of DISH was never discussed with her until she was evaluated by PT.  I previously had reviewed x-rays with the patient in person.  Offered referral to spine specialist since she had no response to PT but she declined at this time.  Offered a second opinion with another rheumatology practice but she has declined.  Offered a follow-up visit with Dr. Corliss Skains but she declined.  When I was asked why she kept having to follow-up with our office I tried to clarify that I was trying to help find outlets for her for pain relief.   The patient has not found my  care to be helpful and does not want to follow-up with me any longer.  The patient left without being examined today.  The conversation was not productive so the encounter was ended.  The patient discussed her frustrations with our onsite office manager Elmyra Ricks prior to leaving today.  A full refund for the visit was provided and no office visit note was completed.  She will no longer be following up in our office. Marissa Graves called to cancel the prescription for methocarbamol since she will no longer be under our care.  Patient plans on following up with her PCP going forward.    Sherron Ales, PA-C

## 2023-01-12 NOTE — Progress Notes (Signed)
Patient left without completing full visit/examination.   No charge visit.

## 2023-03-02 ENCOUNTER — Other Ambulatory Visit: Payer: Self-pay

## 2023-03-02 ENCOUNTER — Ambulatory Visit
Admission: RE | Admit: 2023-03-02 | Discharge: 2023-03-02 | Disposition: A | Discharge status: Home / Self Care | Payer: BC Managed Care – PPO | Source: Ambulatory Visit | Attending: Nurse Practitioner | Admitting: Nurse Practitioner

## 2023-03-02 VITALS — BP 128/75 | HR 68 | Temp 98.4°F | Resp 20

## 2023-03-02 DIAGNOSIS — M4810 Ankylosing hyperostosis [Forestier], site unspecified: Secondary | ICD-10-CM

## 2023-03-02 DIAGNOSIS — M62830 Muscle spasm of back: Secondary | ICD-10-CM | POA: Diagnosis not present

## 2023-03-02 MED ORDER — METHOCARBAMOL 500 MG PO TABS: 500.0000 mg | ORAL_TABLET | Freq: Every day | ORAL | 0 refills | Status: AC

## 2023-03-02 NOTE — ED Triage Notes (Signed)
Pt reports back spasms for last several weeks. Pt reports follow-up with rheumatologist  in a few weeks but reports "can't wait that long". Pt reports has completed physical therapy but denies any improvement in "spasms". Recent dx of DISH.

## 2023-03-02 NOTE — Discharge Instructions (Signed)
Take medication as prescribed. Recommend the use of Tylenol or ibuprofen as needed for pain or discomfort. Continue the application of heat to help with spasm.  Apply for 20 minutes, remove for 1 hour, repeat as needed. Continue exercises provided by physical therapy.  Recommend performing exercises at least 2-3 times daily. If you experience worsening spasm, numbness or tingling in your upper extremities, or other concerns, please follow-up in the emergency department for further evaluation. Keep scheduled appointment with rheumatology. Follow-up as needed.

## 2023-03-02 NOTE — ED Provider Notes (Signed)
[facility]    CSN: [id number] Arrival date & time: 03/02/23  1750      History   Chief Complaint Chief Complaint  Patient presents with   Back Pain    back spasms - dx DISH - Entered by patient    HPI Angela Washington is a 57 y.o. female.   The history is provided by the patient.   The patient presents for complaints of back spasms to the mid right thoracic spine.  Patient reports that she has a history of DISH, and this is causing her symptoms.  She states that she does not have back pain, but states that she may develop spasms at the end of her shift.  Patient states that she is currently under the care of rheumatology, and she is scheduled to follow-up in 2 weeks, but is not sure if she can wait that long with the severity of her back spasms.  Patient states when spasms are severe, she has decreased range of motion and mobility.  Patient denies numbness, tingling, back pain, radiation of pain, or loss of bowel or bladder function.  Patient reports that she has completed physical therapy, has tried stretching, along with heat with minimal relief of the spasms when they occur.   Past Medical History:  Diagnosis Date   DVT (deep venous thrombosis) (HCC)    age 43   Headache(784.0)    Hyperlipidemia 2008   diet controlled   Hypertension    Peripheral vascular disease (HCC) 2008   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   PONV (postoperative nausea and vomiting)    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HCC)    Thrombophilia   Tachycardia 2008   with the PE    Patient Active Problem List   Diagnosis Date Noted   Renal cyst 07/14/2021   ANA positive 05/19/2017   Family history of lupus erythematosus 05/19/2017   History of hypertension 05/19/2017   History of hypercholesterolemia 05/19/2017   History of IBS 05/19/2017   History of migraine 05/19/2017   SVT (supraventricular tachycardia) 05/19/2017   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HCC) 05/19/2017   Primary osteoarthritis of both knees  10/21/2016   Other psoriasis 10/21/2016   Pain in joints of both feet 10/21/2016   S/P hysterectomy 08/22/2013   RLQ abdominal pain 02/25/2012   Ovarian torsion 02/25/2012    Past Surgical History:  Procedure Laterality Date   APPENDECTOMY  2013   BREAST REDUCTION SURGERY  07/26/1998   CESAREAN SECTION  1994, 1998   2   CHOLECYSTECTOMY  1999   LAPAROSCOPY  02/24/2012   Procedure: LAPAROSCOPY OPERATIVE;  Surgeon: Serita Kyle, MD;  Location: [facility];  Service: Gynecology;  Laterality: Right;  with Lyses of Adhesions   RENAL CYST EXCISION     06/2021   ROBOTIC ASSISTED TOTAL HYSTERECTOMY N/A 08/22/2013   Procedure: ROBOTIC ASSISTED TOTAL HYSTERECTOMY/BILATERAL SALPINGECTOMY;  Surgeon: Serita Kyle, MD;  Location: [facility];  Service: Gynecology;  Laterality: N/A;   SALPINGOOPHORECTOMY  02/24/2012   Procedure: SALPINGO OOPHERECTOMY;  Surgeon: Serita Kyle, MD;  Location: [facility];  Service: Gynecology;  Laterality: Right;  Right Salpingo-Oophorectomy; Left Salpingectomy   TONSILLECTOMY     age 57   TUBAL LIGATION  1998    OB History   No obstetric history on file.      Home Medications    Prior to Admission medications   Medication Sig Start Date End Date Taking? Authorizing Provider  methocarbamol (ROBAXIN) 500 MG tablet Take  1 tablet (500 mg total) by mouth daily. 03/02/23  Yes Basilia Stuckert-Warren, Sadie Haber, NP  BYSTOLIC 20 MG TABS Take 20 mg by mouth at bedtime. 04/01/17   [provider]  docusate sodium (COLACE) 100 MG capsule Take 1 capsule (100 mg total) by mouth 2 (two) times daily. Patient not taking: Reported on 04/27/2022 07/14/21   Harrie Foreman, PA-C  ELIQUIS 5 MG TABS tablet Take 5 mg by mouth 2 (two) times daily. 04/25/22   [provider]  HYDROcodone-acetaminophen (NORCO) 5-325 MG tablet Take 1-2 tablets by mouth every 6 (six) hours as needed for moderate pain. Patient not taking: Reported on 04/27/2022 07/14/21   Harrie Foreman, PA-C   JANUMET XR 50-1000 MG TB24 Take 1 tablet by mouth every morning. 04/26/21   [provider]  LANTUS SOLOSTAR 100 UNIT/ML Solostar Pen INJECT 10 UNITS EVERY DAY    [provider]  lisinopril (ZESTRIL) 20 MG tablet Take 20 mg by mouth every morning. 04/26/21   [provider]  promethazine (PHENERGAN) 12.5 MG tablet Take 1 tablet (12.5 mg total) by mouth every 4 (four) hours as needed for nausea or vomiting. Patient not taking: Reported on 04/27/2022 07/14/21   Harrie Foreman, PA-C  rizatriptan (MAXALT) 10 MG tablet Take 10 mg by mouth as needed for migraine. May repeat in 2 hours if needed for migraines.    [provider]  rosuvastatin (CRESTOR) 10 MG tablet Take 10 mg by mouth at bedtime. 01/30/21   [provider]    Family History Family History  Problem Relation Age of Onset   Parkinsonism Mother    Heart attack Mother    Heart attack Father    Thyroid cancer Sister    Healthy Son    Healthy Son     Social History Social History   Tobacco Use   Smoking status: Never    Passive exposure: Never   Smokeless tobacco: Never  Vaping Use   Vaping status: Never Used  Substance Use Topics   Alcohol use: No   Drug use: No     Allergies   Morphine   Review of Systems Review of Systems Per HPI  Physical Exam Triage Vital Signs ED Triage Vitals  Encounter Vitals Group     BP 03/02/23 1808 128/75     Systolic BP Percentile --      Diastolic BP Percentile --      Pulse Rate 03/02/23 1808 68     Resp 03/02/23 1808 20     Temp 03/02/23 1808 98.4 F (36.9 C)     Temp Source 03/02/23 1808 Oral     SpO2 03/02/23 1808 97 %     Weight --      Height --      Head Circumference --      Peak Flow --      Pain Score 03/02/23 1809 0     Pain Loc --      Pain Education --      Exclude from Growth Chart --    No data found.  Updated Vital Signs BP 128/75 (BP Location: Right Arm)   Pulse 68   Temp 98.4 F (36.9 C) (Oral)   Resp  20   LMP 08/10/2013 Comment: ablation  SpO2 97%   Visual Acuity Right Eye Distance:   Left Eye Distance:   Bilateral Distance:    Right Eye Near:   Left Eye Near:    Bilateral Near:  Physical Exam Vitals and nursing note reviewed.  Constitutional:      General: She is not in acute distress.    Appearance: Normal appearance.  HENT:     Head: Normocephalic.  Eyes:     Extraocular Movements: Extraocular movements intact.     Pupils: Pupils are equal, round, and reactive to light.  Pulmonary:     Effort: Pulmonary effort is normal.  Musculoskeletal:     Cervical back: Normal range of motion.     Thoracic back: No swelling, deformity, spasms or tenderness. Decreased range of motion.  Skin:    General: Skin is warm and dry.  Neurological:     General: No focal deficit present.     Mental Status: She is alert and oriented to person, place, and time.  Psychiatric:        Mood and Affect: Mood normal.        Behavior: Behavior normal.      UC Treatments / Results  Labs (all labs ordered are listed, but only abnormal results are displayed) Labs Reviewed - No data to display  EKG   Radiology No results found.  Procedures Procedures (including critical care time)  Medications Ordered in UC Medications - No data to display  Initial Impression / Assessment and Plan / UC Course  I have reviewed the triage vital signs and the nursing notes.  Pertinent labs & imaging results that were available during my care of the patient were reviewed by me and considered in my medical decision making (see chart for details).  The patient is well-appearing, she is in no acute distress, signs are stable.  Patient with history of DISH that causes spasms to the right thoracic spine.  No obvious tenderness or spasm present to the thoracic spine at this time.  Will treat patient with methocarbamol 500 mg tablets for spasm.  Supportive care recommendations were provided and discussed  with the patient to include continuing physical therapy exercises, use of ice or heat, and over-the-counter analgesics such as ibuprofen for pain or discomfort.  Patient was advised to keep scheduled appointment with rheumatology.  Patient was given strict ER follow-up precautions.  Patient is in agreement with this plan of care and verbalizes understanding.  All questions were answered.  Patient stable for discharge.   Final Clinical Impressions(s) / UC Diagnoses   Final diagnoses:  DISH (diffuse idiopathic skeletal hyperostosis)  Spasm of back muscles     Discharge Instructions      Take medication as prescribed. Recommend the use of Tylenol or ibuprofen as needed for pain or discomfort. Continue the application of heat to help with spasm.  Apply for 20 minutes, remove for 1 hour, repeat as needed. Continue exercises provided by physical therapy.  Recommend performing exercises at least 2-3 times daily. If you experience worsening spasm, numbness or tingling in your upper extremities, or other concerns, please follow-up in the emergency department for further evaluation. Keep scheduled appointment with rheumatology. Follow-up as needed.     ED Prescriptions     Medication Sig Dispense Auth. Provider   methocarbamol (ROBAXIN) 500 MG tablet Take 1 tablet (500 mg total) by mouth daily. 20 tablet Devina Bezold-Warren, Sadie Haber, NP      PDMP not reviewed this encounter.   Abran Cantor, NP 03/02/23 1850

## 2023-03-08 NOTE — Progress Notes (Unsigned)
Office Visit Note  Patient: Angela Washington             Date of Birth: 04-15-1966           MRN: [id number]             PCP: Aggie Cosier, MD Referring: Aggie Cosier, MD Visit Date: 03/09/2023 Occupation: @GUAROCC @  Subjective:  Pain in joints  History of Present Illness: Angela Washington is a 57 y.o. female with DISH, osteoarthritis and positive ANA.  Patient states she continues to have thoracic pain and lower back pain.  She denies any morning stiffness.  She has occasional nocturnal pain when she turns to the right side.  She works as a Immunologist.  She states after working for several hours she starts having thoracic pain.  She has some stiffness in her hands and her feet.  She has not noticed any joint swelling.  She has intermittent pain in the trochanteric region.  She went to physical therapy which was very helpful.  She has been walking, doing stretching exercises and also swims on a regular basis.  She denies any history of oral ulcers, nasal ulcers, malar rash, photosensitivity, Raynaud's phenomenon, lymphadenopathy or inflammatory arthritis.  She had no recent episodes of psoriasis.      Activities of Daily Living:  Patient reports morning stiffness for 0 minute.   Patient Reports nocturnal pain.  Difficulty dressing/grooming: Denies Difficulty climbing stairs: Denies Difficulty getting out of chair: Denies Difficulty using hands for taps, buttons, cutlery, and/or writing: Denies  Review of Systems  Constitutional:  Negative for fatigue.  HENT:  Negative for mouth sores and mouth dryness.   Eyes:  Negative for discharge.  Respiratory:  Negative for shortness of breath.   Cardiovascular:  Negative for palpitations and hypertension.  Gastrointestinal:  Negative for blood in stool, constipation and diarrhea.  Endocrine: Negative for increased urination.  Genitourinary:  Negative for decreased urine output.  Musculoskeletal:  Positive for joint pain, joint pain, myalgias and  myalgias. Negative for gait problem and joint swelling.  Skin:  Negative for color change, rash and sensitivity to sunlight.  Allergic/Immunologic: Positive for susceptible to infections.  Neurological:  Positive for dizziness.  Hematological:  Positive for swollen glands.  Psychiatric/Behavioral:  Positive for depressed mood and sleep disturbance. The patient is nervous/anxious.     PMFS History:  Patient Active Problem List   Diagnosis Date Noted   Renal cyst 07/14/2021   ANA positive 05/19/2017   Family history of lupus erythematosus 05/19/2017   History of hypertension 05/19/2017   History of hypercholesterolemia 05/19/2017   History of IBS 05/19/2017   History of migraine 05/19/2017   SVT (supraventricular tachycardia) 05/19/2017   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HCC) 05/19/2017   Primary osteoarthritis of both knees 10/21/2016   Other psoriasis 10/21/2016   Pain in joints of both feet 10/21/2016   S/P hysterectomy 08/22/2013   RLQ abdominal pain 02/25/2012   Ovarian torsion 02/25/2012    Past Medical History:  Diagnosis Date   DVT (deep venous thrombosis) (HCC)    age 34   Headache(784.0)    Hyperlipidemia 2008   diet controlled   Hypertension    Peripheral vascular disease (HCC) 2008   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   PONV (postoperative nausea and vomiting)    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HCC)    Thrombophilia   Tachycardia 2008   with the PE    Family History  Problem Relation Age of Onset  Parkinsonism Mother    Heart attack Mother    Heart attack Father    Thyroid cancer Sister    Healthy Son    Healthy Son    Past Surgical History:  Procedure Laterality Date   APPENDECTOMY  2013   BREAST REDUCTION SURGERY  07/26/1998   CESAREAN SECTION  1994, 1998   2   CHOLECYSTECTOMY  1999   LAPAROSCOPY  02/24/2012   Procedure: LAPAROSCOPY OPERATIVE;  Surgeon: Serita Kyle, MD;  Location: [facility];  Service: Gynecology;  Laterality: Right;  with Lyses of  Adhesions   RENAL CYST EXCISION     06/2021   ROBOTIC ASSISTED TOTAL HYSTERECTOMY N/A 08/22/2013   Procedure: ROBOTIC ASSISTED TOTAL HYSTERECTOMY/BILATERAL SALPINGECTOMY;  Surgeon: Serita Kyle, MD;  Location: [facility];  Service: Gynecology;  Laterality: N/A;   SALPINGOOPHORECTOMY  02/24/2012   Procedure: SALPINGO OOPHERECTOMY;  Surgeon: Serita Kyle, MD;  Location: [facility];  Service: Gynecology;  Laterality: Right;  Right Salpingo-Oophorectomy; Left Salpingectomy   TONSILLECTOMY     age 89   TUBAL LIGATION  68   Social History   Social History Narrative   Not on file    There is no immunization history on file for this patient.   Objective: Vital Signs: BP 115/77 (BP Location: Left Arm, Patient Position: Sitting, Cuff Size: Normal)   Pulse 87   Resp 16   Ht 5\' 4"  (1.626 m)   Wt 180 lb 6.4 oz (81.8 kg)   LMP 08/10/2013 Comment: ablation  BMI 30.97 kg/m    Physical Exam Vitals and nursing note reviewed.  Constitutional:      Appearance: She is well-developed.  HENT:     Head: Normocephalic and atraumatic.  Eyes:     Conjunctiva/sclera: Conjunctivae normal.  Cardiovascular:     Rate and Rhythm: Normal rate and regular rhythm.     Heart sounds: Normal heart sounds.  Pulmonary:     Effort: Pulmonary effort is normal.     Breath sounds: Normal breath sounds.  Abdominal:     General: Bowel sounds are normal.     Palpations: Abdomen is soft.  Musculoskeletal:     Cervical back: Normal range of motion.  Lymphadenopathy:     Cervical: No cervical adenopathy.  Skin:    General: Skin is warm and dry.     Capillary Refill: Capillary refill takes less than 2 seconds.  Neurological:     Mental Status: She is alert and oriented to person, place, and time.  Psychiatric:        Behavior: Behavior normal.      Musculoskeletal Exam: Cervical spine was in good range of motion.  She had limited rotation of the thoracic spine.  She had no point tenderness over  thoracic region.  She had no point tenderness over the lumbar spine or SI joints.  Shoulder joints, elbow joints, wrist joints, MCPs PIPs and DIPs were in good range of motion with no synovitis.  Mild DIP thickening was noted.  Hip joints and knee joints were in good range of motion.  No warmth swelling or effusion was noted.  There was no tenderness over ankles or MTPs.  Bilateral first MTP thickening was noted.  CDAI Exam: CDAI Score: -- Patient Global: --; Provider Global: -- Swollen: --; Tender: -- Joint Exam 03/09/2023   No joint exam has been documented for this visit   There is currently no information documented on the homunculus. Go to the Rheumatology activity and complete  the homunculus joint exam.  Investigation: No additional findings.  Imaging: No results found.  Recent Labs: Lab Results  Component Value Date   WBC 7.2 07/07/2021   HGB 14.2 07/15/2021   PLT 265 07/07/2021   NA 136 07/15/2021   K 4.2 07/15/2021   CL 101 07/15/2021   CO2 25 07/15/2021   GLUCOSE 190 (H) 07/15/2021   BUN 15 07/15/2021   CREATININE 0.83 07/15/2021   BILITOT 0.5 05/22/2018   ALKPHOS 72 11/22/2017   AST 24 05/22/2018   ALT 41 (H) 05/22/2018   PROT 7.2 05/22/2018   ALBUMIN 3.7 11/22/2017   CALCIUM 8.8 (L) 07/15/2021   GFRAA 75 05/22/2018    Speciality Comments: No specialty comments available.  Procedures:  No procedures performed Allergies: Morphine   Assessment / Plan:     Visit Diagnoses: Polyarthralgia - 04/27/2022: ANA: 1:80 nuclear, fine speckled, anti-CCP negative, RF negative, CK within normal limits, ESR WNL, CRP within normal limits, and HLA-B27 negative: She continues to have pain and discomfort in her bilateral hands, knee joints and her feet.  She also has thoracic and lumbar pain.  No synovitis was noted on the examination.  Patient denies any history of joint inflammation.  DISH (diffuse idiopathic skeletal hyperostosis) - X-ray results from April 27, 2022  of  her thoracic spine were reviewed with the patient which were suggestive of diffuse idiopathic skeletal hyperostosis (DISH).  She continues to have right sided thoracic pain.  She has chronic thoracic discomfort.  She tried physical therapy which was helpful.  Stretching exercises were emphasized.  Patient has been walking and swimming which is helpful.  Her pain usually gets worse towards the end of the day.  DDD (degenerative disc disease), thoracic - X-rays of the thoracic spine were obtained on 04/27/2022 which were consistent with degenerative disc disease.  She has chronic thoracic discomfort.  DDD (degenerative disc disease), lumbar - HLA-B27 negative: ESR and CRP within normal limits 04/27/22. X-rays of the lumbar spine from April 27, 2022 were reviewed which were consistent with multilevel spondylosis and facet joint arthropathy.  Patient uses occasional back brace.  Core strengthening exercises were emphasized.  Patient has exercises from physical therapy.  Primary osteoarthritis of both hands-mild and DIP thickening was noted.  No synovitis was noted.  Joint protection was discussed.  Trochanteric bursitis of left hip-she has intermittent discomfort.  Primary osteoarthritis of both knees-no warmth swelling or effusion was noted.  She has occasional discomfort.  Primary osteoarthritis of both feet - RF- Anti-CCP-, ESR and CRP WNL: HLA-B27 negative.  X-ray findings from 04/27/2022 were consistent with osteoarthritis.  X-rays were reviewed with the patient today.  Proper fitting shoes were advised.  Positive ANA (antinuclear antibody) - ANA 1: 80 nuclear, fine speckled.  She denies any history of oral ulcers, nasal ulcers, sicca symptoms, malar rash, photosensitivity, Raynaud's or lymphadenopathy.  There is no history of inflammatory arthritis.  No clinical features of systemic lupus at this time.  Advised patient to contact me if she develops any new symptoms.  No further labs are necessary at  this point.  Psoriasis - Not confirmed by dermatology.  Patient has never had a skin biopsy.  No clinical features of active psoriasis at this time.HLA-B27 negative.  History of pulmonary embolism - At age 75.  Chronic anticoagulation - she is on Eliquis.  Prothrombin gene mutation [facility])  Other medical problems are listed as follows:  History of IBS  Hx of migraines  History of hypercholesterolemia  History of cholecystectomy  History of appendectomy  History of hypertension  SVT (supraventricular tachycardia)  Family history of lupus erythematosus-mother and maternal aunt  Orders: No orders of the defined types were placed in this encounter.  No orders of the defined types were placed in this encounter.  Face-to-face time spent patient was over 30 minutes.  Greater than 50% time was spent in counseling and coordination of care.  Follow-Up Instructions: Return in about 2 years (around 03/08/2025) for Osteoarthritis, DISH, +ANA.   Pollyann Savoy, MD  Note - This record has been created using Animal nutritionist.  Chart creation errors have been sought, but may not always  have been located. Such creation errors do not reflect on  the standard of medical care.

## 2023-03-09 ENCOUNTER — Ambulatory Visit: Payer: BC Managed Care – PPO | Attending: Physician Assistant | Admitting: Rheumatology

## 2023-03-09 ENCOUNTER — Encounter: Payer: Self-pay | Admitting: Rheumatology

## 2023-03-09 VITALS — BP 115/77 | HR 87 | Resp 16 | Ht 64.0 in | Wt 180.4 lb

## 2023-03-09 DIAGNOSIS — M4810 Ankylosing hyperostosis [Forestier], site unspecified: Secondary | ICD-10-CM | POA: Diagnosis not present

## 2023-03-09 DIAGNOSIS — M2550 Pain in unspecified joint: Secondary | ICD-10-CM | POA: Diagnosis not present

## 2023-03-09 DIAGNOSIS — Z8719 Personal history of other diseases of the digestive system: Secondary | ICD-10-CM

## 2023-03-09 DIAGNOSIS — L409 Psoriasis, unspecified: Secondary | ICD-10-CM

## 2023-03-09 DIAGNOSIS — R768 Other specified abnormal immunological findings in serum: Secondary | ICD-10-CM

## 2023-03-09 DIAGNOSIS — Z86711 Personal history of pulmonary embolism: Secondary | ICD-10-CM

## 2023-03-09 DIAGNOSIS — M19042 Primary osteoarthritis, left hand: Secondary | ICD-10-CM

## 2023-03-09 DIAGNOSIS — M7062 Trochanteric bursitis, left hip: Secondary | ICD-10-CM

## 2023-03-09 DIAGNOSIS — M5136 Other intervertebral disc degeneration, lumbar region: Secondary | ICD-10-CM | POA: Diagnosis not present

## 2023-03-09 DIAGNOSIS — Z840 Family history of diseases of the skin and subcutaneous tissue: Secondary | ICD-10-CM

## 2023-03-09 DIAGNOSIS — Z8669 Personal history of other diseases of the nervous system and sense organs: Secondary | ICD-10-CM

## 2023-03-09 DIAGNOSIS — Z87898 Personal history of other specified conditions: Secondary | ICD-10-CM

## 2023-03-09 DIAGNOSIS — M5134 Other intervertebral disc degeneration, thoracic region: Secondary | ICD-10-CM | POA: Diagnosis not present

## 2023-03-09 DIAGNOSIS — M19071 Primary osteoarthritis, right ankle and foot: Secondary | ICD-10-CM

## 2023-03-09 DIAGNOSIS — M19041 Primary osteoarthritis, right hand: Secondary | ICD-10-CM

## 2023-03-09 DIAGNOSIS — Z9049 Acquired absence of other specified parts of digestive tract: Secondary | ICD-10-CM

## 2023-03-09 DIAGNOSIS — D6852 Prothrombin gene mutation: Secondary | ICD-10-CM

## 2023-03-09 DIAGNOSIS — I471 Supraventricular tachycardia, unspecified: Secondary | ICD-10-CM

## 2023-03-09 DIAGNOSIS — Z8679 Personal history of other diseases of the circulatory system: Secondary | ICD-10-CM

## 2023-03-09 DIAGNOSIS — Z8639 Personal history of other endocrine, nutritional and metabolic disease: Secondary | ICD-10-CM

## 2023-03-09 DIAGNOSIS — M5384 Other specified dorsopathies, thoracic region: Secondary | ICD-10-CM

## 2023-03-09 DIAGNOSIS — M170 Bilateral primary osteoarthritis of knee: Secondary | ICD-10-CM

## 2023-03-09 DIAGNOSIS — M19072 Primary osteoarthritis, left ankle and foot: Secondary | ICD-10-CM

## 2023-03-09 DIAGNOSIS — Z7901 Long term (current) use of anticoagulants: Secondary | ICD-10-CM

## 2023-03-17 ENCOUNTER — Ambulatory Visit: Payer: BC Managed Care – PPO | Admitting: Rheumatology

## 2023-04-14 DIAGNOSIS — N3000 Acute cystitis without hematuria: Secondary | ICD-10-CM | POA: Diagnosis not present

## 2023-04-14 DIAGNOSIS — Z6828 Body mass index (BMI) 28.0-28.9, adult: Secondary | ICD-10-CM | POA: Diagnosis not present

## 2023-04-14 DIAGNOSIS — E663 Overweight: Secondary | ICD-10-CM | POA: Diagnosis not present

## 2023-04-16 DIAGNOSIS — L0390 Cellulitis, unspecified: Secondary | ICD-10-CM | POA: Diagnosis not present

## 2023-04-16 DIAGNOSIS — Z6830 Body mass index (BMI) 30.0-30.9, adult: Secondary | ICD-10-CM | POA: Diagnosis not present

## 2023-04-16 DIAGNOSIS — E669 Obesity, unspecified: Secondary | ICD-10-CM | POA: Diagnosis not present

## 2023-04-25 DIAGNOSIS — N6341 Unspecified lump in right breast, subareolar: Secondary | ICD-10-CM | POA: Diagnosis not present

## 2023-04-28 DIAGNOSIS — N6310 Unspecified lump in the right breast, unspecified quadrant: Secondary | ICD-10-CM | POA: Diagnosis not present

## 2023-04-28 DIAGNOSIS — N6315 Unspecified lump in the right breast, overlapping quadrants: Secondary | ICD-10-CM | POA: Diagnosis not present

## 2023-06-03 DIAGNOSIS — E119 Type 2 diabetes mellitus without complications: Secondary | ICD-10-CM | POA: Diagnosis not present

## 2024-05-30 ENCOUNTER — Emergency Department (HOSPITAL_COMMUNITY)
Admission: EM | Admit: 2024-05-30 | Discharge: 2024-05-30 | Disposition: A | Discharge status: Home / Self Care | Attending: Emergency Medicine | Admitting: Emergency Medicine

## 2024-05-30 ENCOUNTER — Emergency Department (HOSPITAL_COMMUNITY)

## 2024-05-30 ENCOUNTER — Other Ambulatory Visit: Payer: Self-pay

## 2024-05-30 ENCOUNTER — Encounter (HOSPITAL_COMMUNITY): Payer: Self-pay

## 2024-05-30 DIAGNOSIS — R112 Nausea with vomiting, unspecified: Secondary | ICD-10-CM | POA: Insufficient documentation

## 2024-05-30 DIAGNOSIS — Z7901 Long term (current) use of anticoagulants: Secondary | ICD-10-CM | POA: Diagnosis not present

## 2024-05-30 DIAGNOSIS — I10 Essential (primary) hypertension: Secondary | ICD-10-CM | POA: Insufficient documentation

## 2024-05-30 DIAGNOSIS — R519 Headache, unspecified: Secondary | ICD-10-CM | POA: Insufficient documentation

## 2024-05-30 DIAGNOSIS — Z79899 Other long term (current) drug therapy: Secondary | ICD-10-CM | POA: Insufficient documentation

## 2024-05-30 DIAGNOSIS — R42 Dizziness and giddiness: Secondary | ICD-10-CM | POA: Diagnosis not present

## 2024-05-30 DIAGNOSIS — M542 Cervicalgia: Secondary | ICD-10-CM | POA: Insufficient documentation

## 2024-05-30 LAB — COMPREHENSIVE METABOLIC PANEL WITH GFR
ALT: 37 U/L (ref 0–44)
AST: 24 U/L (ref 15–41)
Albumin: 4.6 g/dL (ref 3.5–5.0)
Alkaline Phosphatase: 109 U/L (ref 38–126)
Anion gap: 12 (ref 5–15)
BUN: 21 mg/dL — ABNORMAL HIGH (ref 6–20)
CO2: 28 mmol/L (ref 22–32)
Calcium: 10.2 mg/dL (ref 8.9–10.3)
Chloride: 99 mmol/L (ref 98–111)
Creatinine, Ser: 0.9 mg/dL (ref 0.44–1.00)
GFR, Estimated: >60 mL/min (ref >60)
Glucose, Bld: 227 mg/dL — ABNORMAL HIGH (ref 70–99)
Potassium: 4.8 mmol/L (ref 3.5–5.1)
Sodium: 139 mmol/L (ref 135–145)
Total Bilirubin: 0.7 mg/dL (ref 0.0–1.2)
Total Protein: 8.1 g/dL (ref 6.5–8.1)

## 2024-05-30 LAB — URINALYSIS, ROUTINE W REFLEX MICROSCOPIC
Bacteria, UA: NONE SEEN
Bilirubin Urine: NEGATIVE
Glucose, UA: 50 mg/dL — AB
Hgb urine dipstick: NEGATIVE
Ketones, ur: 5 mg/dL — AB
Leukocytes,Ua: NEGATIVE
Nitrite: NEGATIVE
Protein, ur: 30 mg/dL — AB
Specific Gravity, Urine: 1.031 — ABNORMAL HIGH (ref 1.005–1.030)
pH: 5 (ref 5.0–8.0)

## 2024-05-30 LAB — CBC
HCT: 47 % — ABNORMAL HIGH (ref 36.0–46.0)
Hemoglobin: 16.4 g/dL — ABNORMAL HIGH (ref 12.0–15.0)
MCH: 29.4 pg (ref 26.0–34.0)
MCHC: 34.9 g/dL (ref 30.0–36.0)
MCV: 84.2 fL (ref 80.0–100.0)
Platelets: 271 K/uL (ref 150–400)
RBC: 5.58 MIL/uL — ABNORMAL HIGH (ref 3.87–5.11)
RDW: 12.4 % (ref 11.5–15.5)
WBC: 7.3 K/uL (ref 4.0–10.5)
nRBC: 0 % (ref 0.0–0.2)

## 2024-05-30 LAB — LIPASE, BLOOD: Lipase: 50 U/L (ref 11–51)

## 2024-05-30 LAB — TROPONIN T, HIGH SENSITIVITY: Troponin T High Sensitivity: <15 ng/L (ref 0–19)

## 2024-05-30 MED ORDER — DIPHENHYDRAMINE HCL 50 MG/ML IJ SOLN
25.0000 mg | Freq: Once | INTRAMUSCULAR | Status: AC
Start: 2024-05-30 — End: 2024-05-30
  Administered 2024-05-30: 25 mg via INTRAVENOUS
  Filled 2024-05-30: qty 1

## 2024-05-30 MED ORDER — PANTOPRAZOLE SODIUM 40 MG IV SOLR
40.0000 mg | Freq: Once | INTRAVENOUS | Status: AC
  Administered 2024-05-30: 40 mg via INTRAVENOUS
  Filled 2024-05-30: qty 10

## 2024-05-30 MED ORDER — SODIUM CHLORIDE 0.9 % IV BOLUS
1000.0000 mL | Freq: Once | INTRAVENOUS | Status: AC
  Administered 2024-05-30: 1000 mL via INTRAVENOUS

## 2024-05-30 MED ORDER — DIAZEPAM 2 MG PO TABS: 5.0000 mg | ORAL_TABLET | Freq: Two times a day (BID) | ORAL | 0 refills | Status: DC

## 2024-05-30 MED ORDER — MECLIZINE HCL 12.5 MG PO TABS
25.0000 mg | ORAL_TABLET | Freq: Once | ORAL | Status: AC
  Administered 2024-05-30: 25 mg via ORAL
  Filled 2024-05-30: qty 2

## 2024-05-30 MED ORDER — ONDANSETRON 4 MG PO TBDP: 4.0000 mg | ORAL_TABLET | Freq: Four times a day (QID) | ORAL | 0 refills | Status: AC | PRN

## 2024-05-30 MED ORDER — METOCLOPRAMIDE HCL 5 MG/ML IJ SOLN
10.0000 mg | Freq: Once | INTRAMUSCULAR | Status: AC
  Administered 2024-05-30: 10 mg via INTRAVENOUS
  Filled 2024-05-30: qty 2

## 2024-05-30 MED ORDER — DIAZEPAM 2 MG PO TABS: 2.0000 mg | ORAL_TABLET | Freq: Two times a day (BID) | ORAL | 0 refills | Status: AC

## 2024-05-30 MED ORDER — DIAZEPAM 2 MG PO TABS
2.0000 mg | ORAL_TABLET | Freq: Once | ORAL | Status: AC
Start: 2024-05-30 — End: 2024-05-30
  Administered 2024-05-30: 2 mg via ORAL
  Filled 2024-05-30 (×2): qty 1

## 2024-05-30 MED ORDER — MECLIZINE HCL 25 MG PO TABS: 25.0000 mg | ORAL_TABLET | Freq: Three times a day (TID) | ORAL | 0 refills | Status: AC | PRN

## 2024-05-30 NOTE — Discharge Instructions (Addendum)
 Was a pleasure to take care of you today.  You are seen for headache, nausea vomiting and dizziness.  You dizziness is most consistent with positional vertigo.  We did an MRI which ruled out stroke as well as CT scan to rule out any bleeding in your brain since you are on blood thinners.  We are glad you are feeling better and we will discharge you home with meclizine which is a medication to help with the vertigo, Zofran  to use as needed for nausea, and Valium as needed for breakthrough dizziness.  The Valium is sedating, do not drive when taking this and do not drive when you have vertigo.  Follow-up with ENT.

## 2024-05-30 NOTE — ED Provider Notes (Signed)
 [place] EMERGENCY DEPARTMENT AT [facility] Provider Note   CSN: [id number] Arrival date & time: 05/30/24  9053     Patient presents with: Headache   Angela Washington is a 59 y.o. female.  She has history of DVT and PE on Eliquis without missed doses, history of hyperlipidemia, hypertension.  In the ER today complaining of headache and dizziness.  States she had a mild left occipital headache and left-sided neck pain yesterday that gradually worsened, states she woke up this morning and got a shower and started feeling very dizzy like everything was spinning, she went back to bed and when she tried to lay down the spinning intensified.  She states she is also having nausea, vomiting and continuous belching since this started this morning.  She states she had mild vertigo earlier this year but is never experienced anything this intense.  She denies fever or chills, denies chest pain or shortness of breath.  She tried Pepcid  prior to arrival without relief.    Headache      Prior to Admission medications   Medication Sig Start Date End Date Taking? Authorizing Provider  BYSTOLIC  20 MG TABS Take 20 mg by mouth at bedtime. 04/01/17   [provider]  ELIQUIS 5 MG TABS tablet Take 5 mg by mouth 2 (two) times daily. 04/25/22   [provider]  JANUMET XR 50-1000 MG TB24 Take 1 tablet by mouth every morning. 04/26/21   [provider]  LANTUS SOLOSTAR 100 UNIT/ML Solostar Pen INJECT 10 UNITS EVERY DAY    [provider]  lisinopril  (ZESTRIL ) 20 MG tablet Take 20 mg by mouth every morning. 04/26/21   [provider]  methocarbamol  (ROBAXIN ) 500 MG tablet Take 1 tablet (500 mg total) by mouth daily. 03/02/23   Leath-Warren, Etta PARAS, NP  rizatriptan (MAXALT) 10 MG tablet Take 10 mg by mouth as needed for migraine. May repeat in 2 hours if needed for migraines.    [provider]  rosuvastatin  (CRESTOR ) 10 MG tablet Take 10 mg by mouth  at bedtime. 01/30/21   [provider]    Allergies: Morphine     Review of Systems  Neurological:  Positive for headaches.    Updated Vital Signs BP 138/67 (BP Location: Left Arm)   Pulse 60   Temp 97.6 F (36.4 C) (Temporal)   Resp 18   Ht 5' 4 (1.626 m)   Wt 81.8 kg   LMP 08/10/2013 Comment: ablation  SpO2 100%   BMI 30.95 kg/m   Physical Exam Vitals and nursing note reviewed.  Constitutional:      General: She is not in acute distress.    Appearance: She is well-developed.  HENT:     Head: Normocephalic and atraumatic.  Eyes:     General: No visual field deficit.    Extraocular Movements: Extraocular movements intact.     Conjunctiva/sclera: Conjunctivae normal.     Pupils: Pupils are equal, round, and reactive to light.  Cardiovascular:     Rate and Rhythm: Normal rate and regular rhythm.     Heart sounds: No murmur heard. Pulmonary:     Effort: Pulmonary effort is normal. No respiratory distress.     Breath sounds: Normal breath sounds.  Abdominal:     Palpations: Abdomen is soft.     Tenderness: There is no abdominal tenderness.  Musculoskeletal:        General: No swelling.     Cervical back: Neck supple.  Skin:    General: Skin is warm and dry.     Capillary Refill: Capillary refill takes less than 2 seconds.  Neurological:     Mental Status: She is alert and oriented to person, place, and time.     GCS: GCS eye subscore is 4. GCS verbal subscore is 5. GCS motor subscore is 6.     Cranial Nerves: Cranial nerves 2-12 are intact. No cranial nerve deficit, dysarthria or facial asymmetry.     Sensory: No sensory deficit.     Motor: No weakness or pronator drift.     Coordination: Coordination normal. Finger-Nose-Finger Test and Heel to Shin Test normal.     Gait: Gait normal.  Psychiatric:        Mood and Affect: Mood normal.     (all labs ordered are listed, but only abnormal results are displayed) Labs Reviewed  LIPASE, BLOOD   COMPREHENSIVE METABOLIC PANEL WITH GFR  CBC  URINALYSIS, ROUTINE W REFLEX MICROSCOPIC    EKG: None  Radiology: No results found.   Procedures   Medications Ordered in the ED - No data to display                                  Medical Decision Making This patient presents to the ED for concern of vertigo and headache, this involves an extensive number of treatment options, and is a complaint that carries with it a high risk of complications and morbidity.  The differential diagnosis includes BPPV, ICH, labyrinthitis, CVA, dehydration, electrolyte disturbance, other   Co morbidities that complicate the patient evaluation  PE on anticoagulation, hypertension, hyperlipidemia  Additional history obtained:  Additional history obtained from EMR External records from outside source obtained and reviewed including previous notes, prior labs   Lab Tests:  I Ordered, and personally interpreted labs.  The pertinent results include: CBC with no leukocytosis or anemia, lipase is normal, CMP with glucose 227 but normal anion gap and normal CO2, opponent negative, no UTI   Imaging Studies ordered:  I ordered imaging studies including CT head I independently visualized and interpreted imaging which showed no intracranial hemorrhage or other acute findings I agree with the radiologist interpretation  MRI brain shows no acute findings     Problem List / ED Course / Critical interventions / Medication management  Vertigo and headache-patient started with gradual onset headache last night in the left side of her neck and occipital area.  No fevers or chills, no meningeal signs.  Given that she is on anticoagulants CT head was ordered and is negative for any intracranial hemorrhage, headache is not severe, not thunderclap, I do not feel she needs further workup for [facility]. This morning she woke up she started feeling dizzy, especially when she would change positions, worse with laying  down.  She felt somewhat off balance with walking.  Did not have any numbness tingling or weakness, no speech difficulty, no focal neurologic findings on exam. she did have some horizontal nystagmus on exam, symptoms reproduced with turning head to the left.  Symptoms felt to be more likely positional but patient has multiple or fractures and was still having symptoms of vertigo after treatment with meclizine and fluids and nausea medicine.  MRI brain was normal, she was given Valium and is feeling much better, she was able to ambulate.  Discussed Epley maneuver for home, ENT follow-up, given prescription for meclizine 3 times  daily and Valium for breakthrough symptoms, she was given strict return precautions. I ordered medication including Valium, meclizine, Reglan  and Benadryl  for ago and headache Reevaluation of the patient after these medicines showed that the patient improved I have reviewed the patients home medicines and have made adjustments as needed    Consider need for admission, patient does not meet criteria at this time, she is improved, ambulatory and agreeable with discharge home.   Amount and/or Complexity of Data Reviewed Labs: ordered. Radiology: ordered.  Risk Prescription drug management.        Final diagnoses:  None    ED Discharge Orders     None          Suellen Sherran DELENA DEVONNA 05/30/24 1620    Garrick Charleston, MD 05/31/24 [phone]

## 2024-05-30 NOTE — ED Triage Notes (Signed)
 Pt arrived via POV from home c/o a headache that began yesterday, and since has progressed to cause the Pt to experience vertigo, N/V and bad acid reflux. Pt endorses feeling dizzy and reports trying Pepcid  PTA w/o relief.

## 2025-03-08 ENCOUNTER — Ambulatory Visit: Payer: BC Managed Care – PPO | Admitting: Rheumatology
